# Patient Record
Sex: Female | Born: 1960 | Race: White | Hispanic: No | Marital: Married | Smoking: Never smoker
Health system: Southern US, Community
[De-identification: ages and names within clinical notes are randomized; demographics above are authoritative.]

## PROBLEM LIST (undated history)

## (undated) DIAGNOSIS — N63 Unspecified lump in unspecified breast: Secondary | ICD-10-CM

## (undated) DIAGNOSIS — I10 Essential (primary) hypertension: Secondary | ICD-10-CM

## (undated) DIAGNOSIS — Z1211 Encounter for screening for malignant neoplasm of colon: Secondary | ICD-10-CM

## (undated) DIAGNOSIS — Z9889 Other specified postprocedural states: Secondary | ICD-10-CM

## (undated) HISTORY — DX: Encounter for screening for malignant neoplasm of colon: Z12.11

## (undated) HISTORY — DX: Unspecified lump in unspecified breast: N63.0

## (undated) HISTORY — DX: Other specified postprocedural states: Z98.890

## (undated) HISTORY — DX: Essential (primary) hypertension: I10

---

## 2005-02-07 HISTORY — PX: BREAST BIOPSY: SHX20

## 2007-02-08 DIAGNOSIS — I10 Essential (primary) hypertension: Secondary | ICD-10-CM

## 2007-02-08 HISTORY — DX: Essential (primary) hypertension: I10

## 2009-02-07 DIAGNOSIS — N63 Unspecified lump in unspecified breast: Secondary | ICD-10-CM

## 2009-02-07 HISTORY — DX: Unspecified lump in unspecified breast: N63.0

## 2010-01-07 DIAGNOSIS — Z9889 Other specified postprocedural states: Secondary | ICD-10-CM

## 2010-01-07 HISTORY — DX: Other specified postprocedural states: Z98.890

## 2010-02-04 ENCOUNTER — Ambulatory Visit: Payer: Self-pay | Admitting: General Surgery

## 2010-02-04 DIAGNOSIS — D241 Benign neoplasm of right breast: Secondary | ICD-10-CM | POA: Insufficient documentation

## 2010-02-04 HISTORY — PX: BREAST SURGERY: SHX581

## 2010-02-05 LAB — PATHOLOGY REPORT

## 2010-02-07 HISTORY — PX: COLONOSCOPY: SHX174

## 2010-03-11 ENCOUNTER — Ambulatory Visit: Payer: Self-pay | Admitting: General Surgery

## 2010-03-11 HISTORY — PX: BREAST SURGERY: SHX581

## 2010-03-16 LAB — PATHOLOGY REPORT

## 2010-07-14 ENCOUNTER — Ambulatory Visit: Payer: Self-pay | Admitting: General Surgery

## 2011-01-17 ENCOUNTER — Ambulatory Visit: Payer: Self-pay | Admitting: General Surgery

## 2011-01-25 LAB — HM COLONOSCOPY

## 2012-01-18 ENCOUNTER — Ambulatory Visit: Payer: Self-pay | Admitting: General Surgery

## 2012-07-30 ENCOUNTER — Encounter: Payer: Self-pay | Admitting: *Deleted

## 2012-07-30 DIAGNOSIS — N63 Unspecified lump in unspecified breast: Secondary | ICD-10-CM | POA: Insufficient documentation

## 2013-01-15 ENCOUNTER — Encounter: Payer: Self-pay | Admitting: *Deleted

## 2013-01-28 ENCOUNTER — Ambulatory Visit: Payer: Self-pay | Admitting: General Surgery

## 2013-02-04 ENCOUNTER — Encounter: Payer: Self-pay | Admitting: General Surgery

## 2013-02-19 ENCOUNTER — Ambulatory Visit (INDEPENDENT_AMBULATORY_CARE_PROVIDER_SITE_OTHER): Payer: BC Managed Care – PPO | Admitting: General Surgery

## 2013-02-19 ENCOUNTER — Encounter: Payer: Self-pay | Admitting: General Surgery

## 2013-02-19 VITALS — BP 138/82 | HR 70 | Resp 12 | Ht 64.0 in | Wt 141.0 lb

## 2013-02-19 DIAGNOSIS — D4860 Neoplasm of uncertain behavior of unspecified breast: Secondary | ICD-10-CM

## 2013-02-19 DIAGNOSIS — Z86018 Personal history of other benign neoplasm: Secondary | ICD-10-CM | POA: Insufficient documentation

## 2013-02-19 NOTE — Patient Instructions (Addendum)
Continue self breast exams. Call office for any new breast issues or concerns. Bilateral screening mammogram and office visit.

## 2013-02-19 NOTE — Progress Notes (Signed)
Patient ID: Alicia Copeland, female   DOB: [birth year], 53 y.o.   MRN: [id number]  Chief Complaint  Patient presents with  . Follow-up    mammogram    HPI Alicia Copeland is a 53 y.o. female.  who presents for her follow up mammogram and  breast evaluation. The most recent mammogram was done on 01-28-13.  Patient does perform regular self breast checks and gets regular mammograms done.  No new breast issues.   HPI  Past Medical History  Diagnosis Date  . Hypertension 2009  . Special screening for malignant neoplasms, colon   . Lump or mass in breast 2011  . 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    Past Surgical History  Procedure Laterality Date  . Breast surgery Right 03/11/2010    reexcision right breast lesion at the 10:00 position with no evidence of residual flow in tumor  . Colonoscopy  2012    Dr. Candace Cruise  . Breast surgery Right 02/04/2010    right breast excision  . Breast biopsy Right 2007    done in [facility]    Family History  Problem Relation Age of Onset  . Cancer Mother     breast cancer, lymphoma, greater than age 39  . Cancer Maternal Grandmother 57    colon cancer, greater than age 4  . Cancer Paternal Grandmother     breast cancer, greater than age 42    Social History History  Substance Use Topics  . Smoking status: Never Smoker   . Smokeless tobacco: Not on file  . Alcohol Use: No    Allergies  Allergen Reactions  . Sulfa Antibiotics Hives and Other (See Comments)    Alters liver function. "breaks out in liver"    Current Outpatient Prescriptions  Medication Sig Dispense Refill  . Cholecalciferol (VITAMIN D-3 PO) Take 400 mg by mouth daily.      Marland Kitchen lisinopril-hydrochlorothiazide (PRINZIDE,ZESTORETIC) 20-12.5 MG per tablet Take 1 tablet by  mouth daily.      . Multiple Vitamin (MULTIVITAMIN) capsule Take 1 capsule by mouth daily.      . Omega-3 Fatty Acids (FISH OIL) 1000 MG CAPS Take 1,000 mg by mouth daily.       No current facility-administered medications for this visit.    Review of Systems Review of Systems  Blood pressure 138/82, pulse 70, resp. rate 12, height 5\' 4"  (1.626 m), weight 141 lb (63.957 kg), last menstrual period 01/27/2013.  Physical Exam Physical Exam  Constitutional: She is oriented to person, place, and time. She appears well-developed and well-nourished.  Eyes: No scleral icterus.  Neck: Neck supple.  Cardiovascular: Normal rate, regular rhythm and normal heart sounds.   Pulmonary/Chest: Effort normal and breath sounds normal. Right breast exhibits no inverted nipple, no mass, no nipple discharge, no skin change and no tenderness. Left breast exhibits no inverted nipple, no mass, no nipple discharge, no skin change and no tenderness.  Lymphadenopathy:    She has no cervical adenopathy.    She has no axillary adenopathy.  Neurological: She is alert and oriented to person, place, and time.  Skin: Skin is warm and dry.    Data Reviewed Bilateral mammograms dated January 28, 2013 were reviewed. Stable asymmetric densities are reported and unchanged from past exams. BI-RAD-2.  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     Plan    Bilateral screening mammogram and office visit in one year.        Robert Bellow 02/19/2013, 8:10 PM

## 2013-03-08 ENCOUNTER — Encounter: Payer: Self-pay | Admitting: General Surgery

## 2013-07-22 LAB — HM PAP SMEAR

## 2013-12-09 ENCOUNTER — Encounter: Payer: Self-pay | Admitting: General Surgery

## 2014-02-12 ENCOUNTER — Encounter: Payer: Self-pay | Admitting: General Surgery

## 2014-02-18 ENCOUNTER — Ambulatory Visit: Payer: BC Managed Care – PPO | Admitting: General Surgery

## 2014-02-24 ENCOUNTER — Ambulatory Visit (INDEPENDENT_AMBULATORY_CARE_PROVIDER_SITE_OTHER): Payer: BC Managed Care – PPO | Admitting: General Surgery

## 2014-02-24 ENCOUNTER — Other Ambulatory Visit: Payer: BC Managed Care – PPO

## 2014-02-24 ENCOUNTER — Encounter: Payer: Self-pay | Admitting: General Surgery

## 2014-02-24 VITALS — BP 118/68 | HR 72 | Resp 14 | Ht 64.0 in | Wt 128.0 lb

## 2014-02-24 DIAGNOSIS — N6310 Unspecified lump in the right breast, unspecified quadrant: Secondary | ICD-10-CM

## 2014-02-24 DIAGNOSIS — R928 Other abnormal and inconclusive findings on diagnostic imaging of breast: Secondary | ICD-10-CM

## 2014-02-24 DIAGNOSIS — N63 Unspecified lump in breast: Secondary | ICD-10-CM

## 2014-02-24 NOTE — Progress Notes (Signed)
Patient ID: [name], female   DOB: January 30, 1961, 54 y.o.   MRN: [id number]  Chief Complaint  Patient presents with  . Follow-up    mammogram     HPI NOMI RUDNICKI is a 54 y.o. female who presents for a breast evaluation. The most recent mammogram was done on 02/11/14 at [facility]. Patient does perform regular self breast checks and gets regular mammograms done.    HPI  Past Medical History  Diagnosis Date  . Hypertension 2009  . Special screening for malignant neoplasms, colon   . Lump or mass in breast 2011  . 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    Past Surgical History  Procedure Laterality Date  . Colonoscopy  2012    Dr. Candace Cruise  . Breast surgery Right 03/11/2010    Reexcision right breast lesion at the 10:00 position with no evidence of residual flow in tumor  . Breast surgery Right 02/04/2010    Right breast re-excision, no evidence of recurrent phyllodes tumor  . Breast biopsy Right 2007    Fibroadenoma, core biopsy, ER/PR negative.done in [place]    Family History  Problem Relation Age of Onset  . Cancer Mother     breast cancer, lymphoma, greater than age 36  . Cancer Maternal Grandmother 19    colon cancer, greater than age 30  . Cancer Paternal Grandmother     breast cancer, greater than age 90    Social History History  Substance Use Topics  . Smoking status: Never Smoker   . Smokeless tobacco: Not on file  . Alcohol Use: No    Allergies  Allergen Reactions  . Sulfa Antibiotics Hives and Other (See Comments)    Alters liver function. "breaks out in liver"    Current Outpatient Prescriptions  Medication Sig Dispense Refill  . lisinopril-hydrochlorothiazide (PRINZIDE,ZESTORETIC) 20-12.5 MG per tablet Take 1 tablet by mouth daily.    .  Multiple Vitamin (MULTIVITAMIN) capsule Take 1 capsule by mouth daily.    . Omega-3 Fatty Acids (FISH OIL) 1000 MG CAPS Take 1,000 mg by mouth daily.     No current facility-administered medications for this visit.    Review of Systems Review of Systems  Constitutional: Negative.   Respiratory: Negative.   Cardiovascular: Negative.     Blood pressure 118/68, pulse 72, resp. rate 14, height 5\' 4"  (1.626 m), weight 128 lb (58.06 kg), last menstrual period 02/04/2014.  Physical Exam Physical Exam  Constitutional: She is oriented to person, place, and time. She appears well-developed and well-nourished.  Eyes: Conjunctivae are normal. No scleral icterus.  Neck: Neck supple.  Cardiovascular: Normal rate, regular rhythm and normal heart sounds.   Pulmonary/Chest: Effort normal and breath sounds normal. Right breast exhibits no inverted nipple, no mass, no nipple discharge, no skin change and no tenderness. Left breast exhibits no inverted nipple, no mass, no nipple discharge, no skin change and no tenderness.  Well healed scar at 9 o'clock right breast   Abdominal: Soft. Bowel sounds are normal. There is no tenderness.  Lymphadenopathy:    She has no cervical adenopathy.    She has no axillary adenopathy.  Neurological: She is alert and oriented to person, place, and time.  Skin: Skin is warm and dry.    Data Reviewed Bilateral  mammograms dated 02/11/2014 were reviewed. This was a screening study. Surgical changes are noted in the right breast. Multiple stable asymmetrical density is appreciated. No interval change. Questionable new nodule in the right medial breast. BI-RADS-0.  Review of these films in comparison to previous studies suggest that this may be an area of focal density rather discrete nodule.  Ultrasound examination of the right breast was completed in the medial half. There is a ill-defined hypoechoic area with some posterior acoustic shadowing measuring 0.44 x 0.68 x  0.78 cm at the 3:00 position 8 cm from the nipple. This does not clearly correlate with the mammographic abnormality. At the 10:00 position a small cystic lesion is identified 3 cm from the nipple measuring less than 0.41 cm in maximal diameter. BI-RADS-3.    Assessment    Abnormal mammogram, questionable ultrasound corollary.    Plan    My initial thought was that a stereotactic biopsy would be mandatory, on review of her old films I think he would be appropriate to obtain the focal spot compression films suggested by the radiologist. If this area persist would proceed to stereotactic biopsy as the density is more clearly seen on mammography than ultrasound. If this area clears on focal spot compression views, we'll likely arrange for a 6 month follow-up with repeat ultrasound at that time.  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    PCP:  Kirkland Hun 02/25/2014, 7:00 PM

## 2014-02-25 ENCOUNTER — Telehealth: Payer: Self-pay

## 2014-02-25 DIAGNOSIS — R928 Other abnormal and inconclusive findings on diagnostic imaging of breast: Secondary | ICD-10-CM | POA: Insufficient documentation

## 2014-02-25 NOTE — Telephone Encounter (Signed)
Called patient about having additional views done of her right breast. Patient is scheduled for additional mammogram views with [facility] of the right breast at [facility] on 03/07/14 at 4:00 pm. Patient is aware of date and time.

## 2014-03-06 ENCOUNTER — Ambulatory Visit: Payer: BC Managed Care – PPO | Admitting: General Surgery

## 2014-03-12 ENCOUNTER — Ambulatory Visit: Payer: BC Managed Care – PPO | Admitting: General Surgery

## 2014-08-27 ENCOUNTER — Other Ambulatory Visit: Payer: Self-pay

## 2014-08-27 MED ORDER — LISINOPRIL-HYDROCHLOROTHIAZIDE 20-12.5 MG PO TABS: 1.0000 | ORAL_TABLET | Freq: Every day | ORAL | Status: DC

## 2014-08-27 NOTE — Telephone Encounter (Signed)
Patient was last seen on 05/02/14, practice partner number is [phone], and pharmacy is [facility] on [address].

## 2015-02-25 ENCOUNTER — Other Ambulatory Visit: Payer: Self-pay | Admitting: Unknown Physician Specialty

## 2015-02-25 NOTE — Telephone Encounter (Signed)
Pt needs check further refills 

## 2015-03-26 ENCOUNTER — Other Ambulatory Visit: Payer: Self-pay | Admitting: Unknown Physician Specialty

## 2015-03-26 NOTE — Telephone Encounter (Signed)
Pt needs check further refills 

## 2015-04-25 ENCOUNTER — Other Ambulatory Visit: Payer: Self-pay | Admitting: Unknown Physician Specialty

## 2015-04-26 NOTE — Telephone Encounter (Signed)
Pt needs seen further refills 

## 2015-05-12 NOTE — Telephone Encounter (Signed)
Called left patient voicemail to return call and schedule follow up appointment. Thanks.

## 2015-05-13 NOTE — Telephone Encounter (Signed)
Called and left patient a voicemail asking for her to please return my call to schedule a f/u appointment.

## 2015-05-18 NOTE — Telephone Encounter (Signed)
Called and scheduled patient a f/u for 06/03/15.

## 2015-05-23 ENCOUNTER — Other Ambulatory Visit: Payer: Self-pay | Admitting: Unknown Physician Specialty

## 2015-05-25 NOTE — Telephone Encounter (Signed)
Pt needs check further refills 

## 2015-05-25 NOTE — Telephone Encounter (Signed)
Pt scheduled for a medication follow up on 06/03/15. Does she need to be seen before that date?

## 2015-05-25 NOTE — Telephone Encounter (Signed)
No, 06/03/15 is fine.

## 2015-06-03 ENCOUNTER — Ambulatory Visit: Payer: Self-pay | Admitting: Unknown Physician Specialty

## 2015-06-17 ENCOUNTER — Encounter: Payer: Self-pay | Admitting: Unknown Physician Specialty

## 2015-06-17 ENCOUNTER — Ambulatory Visit (INDEPENDENT_AMBULATORY_CARE_PROVIDER_SITE_OTHER): Payer: BC Managed Care – PPO | Admitting: Unknown Physician Specialty

## 2015-06-17 VITALS — BP 107/69 | HR 86 | Temp 99.1°F | Ht 63.5 in | Wt 140.0 lb

## 2015-06-17 DIAGNOSIS — Z87898 Personal history of other specified conditions: Secondary | ICD-10-CM

## 2015-06-17 DIAGNOSIS — Z9189 Other specified personal risk factors, not elsewhere classified: Secondary | ICD-10-CM

## 2015-06-17 DIAGNOSIS — I10 Essential (primary) hypertension: Secondary | ICD-10-CM | POA: Diagnosis not present

## 2015-06-17 LAB — MICROALBUMIN, URINE WAIVED
CREATININE, URINE WAIVED: 200 mg/dL (ref 10–300)
MICROALB, UR WAIVED: 30 mg/L — AB (ref 0–19)

## 2015-06-17 MED ORDER — LISINOPRIL-HYDROCHLOROTHIAZIDE 20-12.5 MG PO TABS: 1.0000 | ORAL_TABLET | Freq: Every day | ORAL | Status: DC

## 2015-06-17 NOTE — Patient Instructions (Signed)
Please do call to schedule your mammogram; the number to schedule one at either [facility] or [facility] is [phone]   

## 2015-06-17 NOTE — Progress Notes (Signed)
   BP 107/69 mmHg  Pulse 86  Temp(Src) 99.1 F (37.3 C)  Ht 5' 3.5" (1.613 m)  Wt 140 lb (63.504 kg)  BMI 24.41 kg/m2  SpO2 94%  LMP 06/11/2015 (Exact Date)   Subjective:    Patient ID: Alicia Copeland, female    DOB: Jul 06, 1960, 55 y.o.   MRN: [id number]  HPI: Alicia Copeland is a 55 y.o. female  Chief Complaint  Patient presents with  . Hypertension    follow up and med refill   Hypertension Using medications without difficulty Average home BPs Not checking  No problems or lightheadedness No chest pain with exertion or shortness of breath No Edema  Relevant past medical, surgical, family and social history reviewed and updated as indicated. Interim medical history since our last visit reviewed. Allergies and medications reviewed and updated.  Review of Systems  Constitutional: Negative.   HENT: Negative.   Eyes: Negative.   Respiratory: Negative.   Cardiovascular: Negative.   Gastrointestinal: Negative.   Endocrine: Negative.   Genitourinary: Negative.   Musculoskeletal: Negative.   Skin: Negative.   Allergic/Immunologic: Negative.   Neurological: Negative.   Hematological: Negative.   Psychiatric/Behavioral: Negative.     Per HPI unless specifically indicated above     Objective:    BP 107/69 mmHg  Pulse 86  Temp(Src) 99.1 F (37.3 C)  Ht 5' 3.5" (1.613 m)  Wt 140 lb (63.504 kg)  BMI 24.41 kg/m2  SpO2 94%  LMP 06/11/2015 (Exact Date)  Wt Readings from Last 3 Encounters:  06/17/15 140 lb (63.504 kg)  02/24/14 128 lb (58.06 kg)  02/19/13 141 lb (63.957 kg)    Physical Exam  Constitutional: She is oriented to person, place, and time. She appears well-developed and well-nourished. No distress.  HENT:  Head: Normocephalic and atraumatic.  Eyes: Conjunctivae and lids are normal. Right eye exhibits no discharge. Left eye exhibits no discharge. No scleral icterus.  Neck: Normal range of motion. Neck supple. No JVD present. Carotid bruit is not  present.  Cardiovascular: Normal rate, regular rhythm and normal heart sounds.   Pulmonary/Chest: Effort normal and breath sounds normal.  Abdominal: Normal appearance. There is no splenomegaly or hepatomegaly.  Musculoskeletal: Normal range of motion.  Neurological: She is alert and oriented to person, place, and time.  Skin: Skin is warm, dry and intact. No rash noted. No pallor.  Psychiatric: She has a normal mood and affect. Her behavior is normal. Judgment and thought content normal.       Assessment & Plan:   Problem List Items Addressed This Visit      Unprioritized   Hypertension - Primary   Relevant Medications   lisinopril-hydrochlorothiazide (PRINZIDE,ZESTORETIC) 20-12.5 MG tablet   Other Relevant Orders   Comprehensive metabolic panel   Lipid Panel w/o Chol/HDL Ratio   Microalbumin, Urine Waived   Uric acid   TSH    Other Visit Diagnoses    H/O abnormal mammogram        Relevant Orders    MM DIGITAL SCREENING BILATERAL        Follow up plan: Return in about 6 months (around 12/18/2015) for physican.

## 2015-06-18 LAB — COMPREHENSIVE METABOLIC PANEL
A/G RATIO: 1.8 (ref 1.2–2.2)
ALK PHOS: 57 IU/L (ref 39–117)
ALT: 13 IU/L (ref 0–32)
AST: 14 IU/L (ref 0–40)
Albumin: 4.2 g/dL (ref 3.5–5.5)
BUN/Creatinine Ratio: 19 (ref 9–23)
BUN: 20 mg/dL (ref 6–24)
Bilirubin Total: <0.2 mg/dL (ref 0.0–1.2)
CO2: 29 mmol/L (ref 18–29)
CREATININE: 1.04 mg/dL — AB (ref 0.57–1.00)
Calcium: 9.1 mg/dL (ref 8.7–10.2)
Chloride: 97 mmol/L (ref 96–106)
GFR calc Af Amer: 71 mL/min/{1.73_m2} (ref >59)
GFR calc non Af Amer: 61 mL/min/{1.73_m2} (ref >59)
GLOBULIN, TOTAL: 2.4 g/dL (ref 1.5–4.5)
Glucose: 83 mg/dL (ref 65–99)
POTASSIUM: 4.1 mmol/L (ref 3.5–5.2)
SODIUM: 139 mmol/L (ref 134–144)
Total Protein: 6.6 g/dL (ref 6.0–8.5)

## 2015-06-18 LAB — LIPID PANEL W/O CHOL/HDL RATIO
CHOLESTEROL TOTAL: 207 mg/dL — AB (ref 100–199)
HDL: 37 mg/dL — ABNORMAL LOW (ref >39)
LDL Calculated: 115 mg/dL — ABNORMAL HIGH (ref 0–99)
TRIGLYCERIDES: 274 mg/dL — AB (ref 0–149)
VLDL Cholesterol Cal: 55 mg/dL — ABNORMAL HIGH (ref 5–40)

## 2015-06-18 LAB — URIC ACID: Uric Acid: 6.2 mg/dL (ref 2.5–7.1)

## 2015-06-18 LAB — TSH: TSH: 2.04 u[IU]/mL (ref 0.450–4.500)

## 2015-06-19 ENCOUNTER — Encounter: Payer: Self-pay | Admitting: Unknown Physician Specialty

## 2015-06-26 ENCOUNTER — Other Ambulatory Visit: Payer: Self-pay | Admitting: Unknown Physician Specialty

## 2015-12-16 ENCOUNTER — Encounter (INDEPENDENT_AMBULATORY_CARE_PROVIDER_SITE_OTHER): Payer: Self-pay

## 2015-12-23 ENCOUNTER — Other Ambulatory Visit: Payer: Self-pay | Admitting: Unknown Physician Specialty

## 2015-12-30 ENCOUNTER — Ambulatory Visit (INDEPENDENT_AMBULATORY_CARE_PROVIDER_SITE_OTHER): Payer: BC Managed Care – PPO | Admitting: Unknown Physician Specialty

## 2015-12-30 ENCOUNTER — Encounter: Payer: Self-pay | Admitting: Unknown Physician Specialty

## 2015-12-30 VITALS — BP 132/85 | HR 79 | Temp 97.8°F | Ht 63.2 in | Wt 143.2 lb

## 2015-12-30 DIAGNOSIS — Z0000 Encounter for general adult medical examination without abnormal findings: Secondary | ICD-10-CM | POA: Diagnosis not present

## 2015-12-30 DIAGNOSIS — I10 Essential (primary) hypertension: Secondary | ICD-10-CM | POA: Diagnosis not present

## 2015-12-30 LAB — MICROALBUMIN, URINE WAIVED
CREATININE, URINE WAIVED: 200 mg/dL (ref 10–300)
Microalb, Ur Waived: 10 mg/L (ref 0–19)

## 2015-12-30 NOTE — Patient Instructions (Signed)
Please do call to schedule your mammogram; the number to schedule one at either [facility] or [facility] is [phone]   

## 2015-12-30 NOTE — Progress Notes (Signed)
BP 132/85 (BP Location: Left Arm, Patient Position: Sitting, Cuff Size: Large)   Pulse 79   Temp 97.8 F (36.6 C)   Ht 5' 3.2" (1.605 m)   Wt 143 lb 3.2 oz (65 kg)   LMP 06/29/2015   SpO2 98%   BMI 25.21 kg/m    Subjective:    Patient ID: Alicia Copeland, female    DOB: Sep 10, 1960, 55 y.o.   MRN: [id number]  HPI: Alicia Copeland is a 55 y.o. female  Chief Complaint  Patient presents with  . Annual Exam   Hypertension Using medications without difficulty Average home BPs not checking   No problems or lightheadedness No chest pain with exertion or shortness of breath No Edema  Depression screen PHQ 2/9 12/30/2015  Decreased Interest 0  Down, Depressed, Hopeless 0  PHQ - 2 Score 0  Altered sleeping 0  Tired, decreased energy 0  Change in appetite 0  Feeling bad or failure about yourself  0  Trouble concentrating 0  Moving slowly or fidgety/restless 0  Suicidal thoughts 0  PHQ-9 Score 0   -+------------------ Social History   Social History  . Marital status: Married    Spouse name: N/A  . Number of children: N/A  . Years of education: N/A   Occupational History  . Not on file.   Social History Main Topics  . Smoking status: Never Smoker  . Smokeless tobacco: Never Used  . Alcohol use No  . Drug use: No  . Sexual activity: Yes   Other Topics Concern  . Not on file   Social History Narrative  . No narrative on file   Family History  Problem Relation Age of Onset  . Cancer Mother     breast cancer, lymphoma, greater than age 26  . Diabetes Mother   . Cancer Maternal Grandmother 74    colon cancer, greater than age 69  . Cancer Paternal Grandmother     breast cancer, greater than age 64  . Hypertension Father   . Diabetes Father   . Heart attack Father   . Hypertension Brother   . Heart attack Maternal Grandfather    Past Medical History:  Diagnosis Date  . Hx of breast biopsy 01/2010   right breast at 10:00 position,borderline  phyllodes tumor measuring 3.2cm in diameter with involvement of anterior, dep and lateral margins. 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  . Hypertension 2009  . Lump or mass in breast 2011  . Special screening for malignant neoplasms, colon    Past Surgical History:  Procedure Laterality Date  . BREAST BIOPSY Right 2007   Fibroadenoma, core biopsy, ER/PR negative.done in [place]  . BREAST SURGERY Right 03/11/2010   Reexcision right breast lesion at the 10:00 position with no evidence of residual flow in tumor  . BREAST SURGERY Right 02/04/2010   Right breast re-excision, no evidence of recurrent phyllodes tumor  . COLONOSCOPY  2012   Dr. Candace Cruise    Relevant past medical, surgical, family and social history reviewed and updated as indicated. Interim medical history since our last visit reviewed. Allergies and medications reviewed and updated.  Review of Systems  Constitutional: Negative.   HENT: Negative.   Eyes: Negative.   Respiratory: Negative.   Cardiovascular: Negative.   Gastrointestinal: Negative.   Endocrine: Negative.   Genitourinary: Negative.        No menses since May  Musculoskeletal: Negative.   Skin:  Negative.   Allergic/Immunologic: Negative.   Neurological: Negative.   Hematological: Negative.   Psychiatric/Behavioral: Negative.     Per HPI unless specifically indicated above     Objective:    BP 132/85 (BP Location: Left Arm, Patient Position: Sitting, Cuff Size: Large)   Pulse 79   Temp 97.8 F (36.6 C)   Ht 5' 3.2" (1.605 m)   Wt 143 lb 3.2 oz (65 kg)   LMP 06/29/2015   SpO2 98%   BMI 25.21 kg/m   Wt Readings from Last 3 Encounters:  12/30/15 143 lb 3.2 oz (65 kg)  06/17/15 140 lb (63.5 kg)  02/24/14 128 lb (58.1 kg)    Physical Exam  Constitutional: She is oriented to person, place, and time. She appears well-developed and well-nourished. No distress.  HENT:  Head: Normocephalic and atraumatic.    Eyes: Conjunctivae and lids are normal. Right eye exhibits no discharge. Left eye exhibits no discharge. No scleral icterus.  Neck: Normal range of motion. Neck supple. No JVD present. Carotid bruit is not present.  Cardiovascular: Normal rate, regular rhythm and normal heart sounds.   Pulmonary/Chest: Effort normal and breath sounds normal.  Abdominal: Normal appearance. There is no splenomegaly or hepatomegaly.  Musculoskeletal: Normal range of motion.  Neurological: She is alert and oriented to person, place, and time.  Skin: Skin is warm, dry and intact. No rash noted. No pallor.  Psychiatric: She has a normal mood and affect. Her behavior is normal. Judgment and thought content normal.    Results for orders placed or performed in visit on 06/17/15  Comprehensive metabolic panel  Result Value Ref Range   Glucose 83 65 - 99 mg/dL   BUN 20 6 - 24 mg/dL   Creatinine, Ser 1.04 (H) 0.57 - 1.00 mg/dL   GFR calc non Af Amer 61 >59 mL/min/1.73   GFR calc Af Amer 71 >59 mL/min/1.73   BUN/Creatinine Ratio 19 9 - 23   Sodium 139 134 - 144 mmol/L   Potassium 4.1 3.5 - 5.2 mmol/L   Chloride 97 96 - 106 mmol/L   CO2 29 18 - 29 mmol/L   Calcium 9.1 8.7 - 10.2 mg/dL   Total Protein 6.6 6.0 - 8.5 g/dL   Albumin 4.2 3.5 - 5.5 g/dL   Globulin, Total 2.4 1.5 - 4.5 g/dL   Albumin/Globulin Ratio 1.8 1.2 - 2.2   Bilirubin Total <0.2 0.0 - 1.2 mg/dL   Alkaline Phosphatase 57 39 - 117 IU/L   AST 14 0 - 40 IU/L   ALT 13 0 - 32 IU/L  Lipid Panel w/o Chol/HDL Ratio  Result Value Ref Range   Cholesterol, Total 207 (H) 100 - 199 mg/dL   Triglycerides 274 (H) 0 - 149 mg/dL   HDL 37 (L) >39 mg/dL   VLDL Cholesterol Cal 55 (H) 5 - 40 mg/dL   LDL Calculated 115 (H) 0 - 99 mg/dL  Microalbumin, Urine Waived  Result Value Ref Range   Microalb, Ur Waived 30 (H) 0 - 19 mg/L   Creatinine, Urine Waived 200 10 - 300 mg/dL   Microalb/Creat Ratio <30 <30 mg/g  Uric acid  Result Value Ref Range   Uric Acid  6.2 2.5 - 7.1 mg/dL  TSH  Result Value Ref Range   TSH 2.040 0.450 - 4.500 uIU/mL      Assessment & Plan:   Problem List Items Addressed This Visit      Unprioritized   Hypertension    Stable,  continue present medications.        Relevant Orders   Comprehensive metabolic panel   Lipid Panel w/o Chol/HDL Ratio   Uric acid   Microalbumin, Urine Waived    Other Visit Diagnoses    Routine general medical examination at a health care facility    -  Primary   Relevant Orders   MM DIGITAL SCREENING BILATERAL   TSH   CBC with Differential/Platelet   Lipid Panel w/o Chol/HDL Ratio       Follow up plan: Return in about 6 months (around 06/28/2016).

## 2015-12-30 NOTE — Assessment & Plan Note (Signed)
Stable, continue present medications.   

## 2015-12-31 LAB — CBC WITH DIFFERENTIAL/PLATELET
BASOS: 0 %
Basophils Absolute: 0 10*3/uL (ref 0.0–0.2)
EOS (ABSOLUTE): 0.2 10*3/uL (ref 0.0–0.4)
EOS: 2 %
HEMATOCRIT: 37.4 % (ref 34.0–46.6)
HEMOGLOBIN: 12.6 g/dL (ref 11.1–15.9)
Immature Grans (Abs): 0 10*3/uL (ref 0.0–0.1)
Immature Granulocytes: 0 %
LYMPHS ABS: 2.7 10*3/uL (ref 0.7–3.1)
Lymphs: 27 %
MCH: 29.4 pg (ref 26.6–33.0)
MCHC: 33.7 g/dL (ref 31.5–35.7)
MCV: 87 fL (ref 79–97)
MONOCYTES: 7 %
MONOS ABS: 0.7 10*3/uL (ref 0.1–0.9)
NEUTROS ABS: 6.3 10*3/uL (ref 1.4–7.0)
Neutrophils: 64 %
Platelets: 215 10*3/uL (ref 150–379)
RBC: 4.29 x10E6/uL (ref 3.77–5.28)
RDW: 15.3 % (ref 12.3–15.4)
WBC: 9.8 10*3/uL (ref 3.4–10.8)

## 2015-12-31 LAB — LIPID PANEL W/O CHOL/HDL RATIO
Cholesterol, Total: 214 mg/dL — ABNORMAL HIGH (ref 100–199)
HDL: 49 mg/dL (ref >39)
LDL CALC: 134 mg/dL — AB (ref 0–99)
TRIGLYCERIDES: 156 mg/dL — AB (ref 0–149)
VLDL Cholesterol Cal: 31 mg/dL (ref 5–40)

## 2015-12-31 LAB — COMPREHENSIVE METABOLIC PANEL
ALBUMIN: 4.6 g/dL (ref 3.5–5.5)
ALK PHOS: 73 IU/L (ref 39–117)
ALT: 14 IU/L (ref 0–32)
AST: 13 IU/L (ref 0–40)
Albumin/Globulin Ratio: 2.4 — ABNORMAL HIGH (ref 1.2–2.2)
BUN / CREAT RATIO: 16 (ref 9–23)
BUN: 12 mg/dL (ref 6–24)
Bilirubin Total: 0.4 mg/dL (ref 0.0–1.2)
CO2: 28 mmol/L (ref 18–29)
CREATININE: 0.73 mg/dL (ref 0.57–1.00)
Calcium: 9.7 mg/dL (ref 8.7–10.2)
Chloride: 101 mmol/L (ref 96–106)
GFR, EST AFRICAN AMERICAN: 108 mL/min/{1.73_m2} (ref >59)
GFR, EST NON AFRICAN AMERICAN: 94 mL/min/{1.73_m2} (ref >59)
GLOBULIN, TOTAL: 1.9 g/dL (ref 1.5–4.5)
Glucose: 71 mg/dL (ref 65–99)
Potassium: 4.2 mmol/L (ref 3.5–5.2)
SODIUM: 145 mmol/L — AB (ref 134–144)
TOTAL PROTEIN: 6.5 g/dL (ref 6.0–8.5)

## 2015-12-31 LAB — TSH: TSH: 2.62 u[IU]/mL (ref 0.450–4.500)

## 2015-12-31 LAB — URIC ACID: URIC ACID: 4.5 mg/dL (ref 2.5–7.1)

## 2016-01-04 ENCOUNTER — Encounter: Payer: Self-pay | Admitting: Unknown Physician Specialty

## 2016-04-05 ENCOUNTER — Ambulatory Visit: Payer: BC Managed Care – PPO | Admitting: Family Medicine

## 2016-04-06 ENCOUNTER — Ambulatory Visit (INDEPENDENT_AMBULATORY_CARE_PROVIDER_SITE_OTHER): Payer: BC Managed Care – PPO | Admitting: Unknown Physician Specialty

## 2016-04-06 ENCOUNTER — Encounter: Payer: Self-pay | Admitting: Unknown Physician Specialty

## 2016-04-06 ENCOUNTER — Ambulatory Visit: Payer: BC Managed Care – PPO | Admitting: Unknown Physician Specialty

## 2016-04-06 VITALS — BP 115/74 | HR 90 | Temp 98.2°F | Wt 147.8 lb

## 2016-04-06 DIAGNOSIS — M7022 Olecranon bursitis, left elbow: Secondary | ICD-10-CM | POA: Diagnosis not present

## 2016-04-06 DIAGNOSIS — M1711 Unilateral primary osteoarthritis, right knee: Secondary | ICD-10-CM | POA: Diagnosis not present

## 2016-04-06 NOTE — Patient Instructions (Addendum)
Glucosamine Tumeric Tart cherry juice Gin soaked raisins Tylenol/Aleve/Advil  Bursitis Bursitis is inflammation and irritation of a bursa, which is one of the small, fluid-filled sacs that cushion and protect the moving parts of your body. These sacs are located between bones and muscles, muscle attachments, or skin areas next to bones. A bursa protects these structures from the wear and tear that results from frequent movement. An inflamed bursa causes pain and swelling. Fluid may build up inside the sac. Bursitis is most common near joints, especially the knees, elbows, hips, and shoulders. What are the causes? Bursitis can be caused by:  Injury from:  A direct blow, like falling on your knee or elbow.  Overuse of a joint (repetitive stress).  Infection. This can happen if bacteria gets into a bursa through a cut or scrape near a joint.  Diseases that cause joint inflammation, such as gout and rheumatoid arthritis. What increases the risk? You may be at risk for bursitis if you:  Have a job or hobby that involves a lot of repetitive stress on your joints.  Have a condition that weakens your body's defense system (immune system), such as diabetes, cancer, or HIV.  Lift and reach overhead often.  Kneel or lean on hard surfaces often.  Run or walk often. What are the signs or symptoms? The most common signs and symptoms of bursitis are:  Pain that gets worse when you move the affected body part or put weight on it.  Inflammation.  Stiffness. Other signs and symptoms may include:  Redness.  Tenderness.  Warmth.  Pain that continues after rest.  Fever and chills. This may occur in bursitis caused by infection. How is this diagnosed? Bursitis may be diagnosed by:  Medical history and physical exam.  MRI.  A procedure to drain fluid from the bursa with a needle (aspiration). The fluid may be checked for signs of infection or gout.  Blood tests to rule out other  causes of inflammation. How is this treated? Bursitis can usually be treated at home with rest, ice, compression, and elevation (RICE). For mild bursitis, RICE treatment may be all you need. Other treatments may include:  Nonsteroidal anti-inflammatory drugs (NSAIDs) to treat pain and inflammation.  Corticosteroids to fight inflammation. You may have these drugs injected into and around the area of bursitis.  Aspiration of bursitis fluid to relieve pain and improve movement.  Antibiotic medicine to treat an infected bursa.  A splint, brace, or walking aid.  Physical therapy if you continue to have pain or limited movement.  Surgery to remove a damaged or infected bursa. This may be needed if you have a very bad case of bursitis or if other treatments have not worked. Follow these instructions at home:  Take medicines only as directed by your health care provider.  If you were prescribed an antibiotic medicine, finish it all even if you start to feel better.  Rest the affected area as directed by your health care provider.  Keep the area elevated.  Avoid activities that make pain worse.  Apply ice to the injured area:  Place ice in a plastic bag.  Place a towel between your skin and the bag.  Leave the ice on for 20 minutes, 2-3 times a day.  Use splints, braces, pads, or walking aids as directed by your health care provider.  Keep all follow-up visits as directed by your health care provider. This is important. How is this prevented?  Wear knee pads if  you kneel often.  Wear sturdy running or walking shoes that fit you well.  Take regular breaks from repetitive activity.  Warm up by stretching before doing any strenuous activity.  Maintain a healthy weight or lose weight as recommended by your health care provider. Ask your health care provider if you need help.  Exercise regularly. Start any new physical activity gradually. Contact a health care provider  if:  Your bursitis is not responding to treatment or home care.  You have a fever.  You have chills. This information is not intended to replace advice given to you by your health care provider. Make sure you discuss any questions you have with your health care provider. Document Released: 01/22/2000 Document Revised: 07/02/2015 Document Reviewed: 04/15/2013 Elsevier Interactive Patient Education  2017 [place].  What You Need to Know About Osteoarthritis Osteoarthritis is a type of arthritis that affects tissue that covers the ends of bones in joints (cartilage). Cartilage acts as a cushion between the bones and helps them move smoothly. Osteoarthritis results when cartilage in the joints gets worn down. Osteoarthritis is sometimes called "wear and tear" arthritis. Osteoarthritis can affect any joint and can make movement painful. Hips, knees, fingers, and toes are some of the joints that are most often affected by osteoarthritis. You may be more likely to develop osteoarthritis if:  You are middle-aged or older.  You are obese.  You have injured a joint or had surgery on a joint.  You have a family history of osteoarthritis. How can osteoarthritis affect me? Osteoarthritis can cause:  Pain and swelling in your joint.  Difficulty moving your joint.  A grating or scraping feeling inside the joint when you move it.  Popping or creaking sounds when you move. This condition can make it harder to do things that you need or want to do each day. Osteoarthritis in a major joint, such as your knee or hip, can make it painful to walk or exercise. If you have osteoarthritis in your hands, you might not be able to grip items, twist your hand, or control small movements of your hands and fingers (fine motor skills). Over time, osteoarthritis could cause you to be less physically active. Being less active increases your risk for other long-term (chronic) health problems, such as diabetes  and heart disease. What lifestyle changes can be made? You can lessen the impact that osteoarthritis has on your daily life by:  Switching to low-impact activities that do not put repeated pressure on your joints. For example, if you usually run or jog for exercise, try swimming or riding a bike instead.  Staying active. Build up to at least 150 minutes of physical activity each week to keep your joints healthy and keep your body strong.  Losing weight. If you are overweight or obese, losing weight can take pressure off of your joints. If you need help with weight loss, talk with your health care provider or a diet and nutrition specialist (dietitian). What other changes can be made? You can also lessen the effect of osteoarthritis by:  Using assistive devices. Sometimes a brace, wrap, splint, specialized glove, or cane can help. Talk with your health care provider or physical therapist about when and how to use these.  Working with a physical therapist who can help you find ways to do your daily activities without harming your joints. A physical therapist can also teach you exercises and stretches to strengthen the muscles that support your joints.  Treating pain and  inflammation. Take over-the-counter and prescription medicines for pain and inflammation only as told by your health care provider. If directed, you may put ice on the affected joint:  If you have a removable assistive device, remove it as told by your health care provider.  Put ice in a plastic bag.  Place a towel between your skin and the bag.  Leave the ice on for 20 minutes, 2-3 times a day. If other measures do not work, you may need joint surgery, such as joint replacement. What can happen if changes are not made? Osteoarthritis is a condition that gets worse over time (a progressive condition). If you do not take steps to strengthen your body and to slow down the progress of the disease, your condition may get worse  more quickly. Your joints may stiffen and become swollen, which will make them painful and hard to move. Where to find more information: You can learn more about osteoarthritis from:  [employer]: www.RadioScam.is  [employer] of Arthritis and Musculoskeletal and Skin Diseases: www.niams.CityPerson.tn Contact a health care provider if:  You cannot do your normal activities comfortably.  Your joint does not function at all.  Your pain is interfering with your sleep.  You are gaining weight.  Your joint appears to be changing in shape, instead of just being swollen and sore. Summary  Osteoarthritis is a painful joint disease that gets worse over time.  This condition can lead to other long-term (chronic) health problems.  There are changes that you can make to slow down the progression of the disease. This information is not intended to replace advice given to you by your health care provider. Make sure you discuss any questions you have with your health care provider. Document Released: 09/15/2015 Document Revised: 09/17/2015 Document Reviewed: 09/15/2015 Elsevier Interactive Patient Education  2017 [employer].

## 2016-04-06 NOTE — Progress Notes (Signed)
   BP 115/74 (BP Location: Left Arm, Patient Position: Sitting, Cuff Size: Large)   Pulse 90   Temp 98.2 F (36.8 C)   Wt 147 lb 12.8 oz (67 kg)   SpO2 98%   BMI 26.02 kg/m    Subjective:    Patient ID: Alicia Copeland, female    DOB: Jul 07, 1960, 56 y.o.   MRN: [id number]  HPI: Alicia Copeland is a 56 y.o. female  Chief Complaint  Patient presents with  . Knee Pain    pt states her right knee has been hurting for a while, states it gets tight in the back  . Elbow Pain    pt states her left elbow has a pocket of fluid, she states that it is not painful but sensitive   Right knee pain States this has been bothering her for a while.  If she sits for a period of time she gets up and limps.  Unable to fully extend and it gets tight.  Pain comes and goes.    Elbow Pocket of fluid in elbow for a couple of weeks.    Relevant past medical, surgical, family and social history reviewed and updated as indicated. Interim medical history since our last visit reviewed. Allergies and medications reviewed and updated.  Review of Systems  Per HPI unless specifically indicated above     Objective:    BP 115/74 (BP Location: Left Arm, Patient Position: Sitting, Cuff Size: Large)   Pulse 90   Temp 98.2 F (36.8 C)   Wt 147 lb 12.8 oz (67 kg)   SpO2 98%   BMI 26.02 kg/m   Wt Readings from Last 3 Encounters:  04/06/16 147 lb 12.8 oz (67 kg)  12/30/15 143 lb 3.2 oz (65 kg)  06/17/15 140 lb (63.5 kg)    Physical Exam  Constitutional: She is oriented to person, place, and time. She appears well-developed and well-nourished. No distress.  HENT:  Head: Normocephalic and atraumatic.  Eyes: Conjunctivae and lids are normal. Right eye exhibits no discharge. Left eye exhibits no discharge. No scleral icterus.  Neck: Normal range of motion. Neck supple. No JVD present. Carotid bruit is not present.  Cardiovascular: Normal rate, regular rhythm and normal heart sounds.   Pulmonary/Chest:  Effort normal and breath sounds normal.  Abdominal: Normal appearance. There is no splenomegaly or hepatomegaly.  Musculoskeletal: Normal range of motion.       Left elbow: She exhibits effusion. No tenderness found.       Right knee: She exhibits swelling.  Neurological: She is alert and oriented to person, place, and time.  Skin: Skin is warm, dry and intact. No rash noted. No pallor.  Psychiatric: She has a normal mood and affect. Her behavior is normal. Judgment and thought content normal.   Assessment & Plan:   Problem List Items Addressed This Visit      Unprioritized   Osteoarthritis of right knee    Other Visit Diagnoses    Olecranon bursitis of left elbow    -  Primary   Discussed.  Will monitor      Recommended suportive care.    Follow up plan: Return if symptoms worsen or fail to improve.

## 2016-05-03 ENCOUNTER — Other Ambulatory Visit: Payer: Self-pay

## 2016-05-03 DIAGNOSIS — N644 Mastodynia: Secondary | ICD-10-CM

## 2016-05-05 ENCOUNTER — Other Ambulatory Visit: Payer: Self-pay | Admitting: *Deleted

## 2016-05-05 ENCOUNTER — Inpatient Hospital Stay
Admission: RE | Admit: 2016-05-05 | Discharge: 2016-05-05 | Disposition: A | Discharge status: Home / Self Care | Payer: Self-pay | Source: Ambulatory Visit | Attending: *Deleted | Admitting: *Deleted

## 2016-05-05 DIAGNOSIS — Z9289 Personal history of other medical treatment: Secondary | ICD-10-CM

## 2016-05-12 ENCOUNTER — Ambulatory Visit: Payer: BC Managed Care – PPO | Admitting: General Surgery

## 2016-05-13 ENCOUNTER — Ambulatory Visit: Payer: Self-pay

## 2016-05-13 ENCOUNTER — Other Ambulatory Visit: Payer: Self-pay

## 2016-05-18 ENCOUNTER — Ambulatory Visit: Payer: BC Managed Care – PPO | Admitting: General Surgery

## 2016-05-19 ENCOUNTER — Ambulatory Visit
Admission: RE | Admit: 2016-05-19 | Discharge: 2016-05-19 | Disposition: A | Discharge status: Home / Self Care | Payer: BC Managed Care – PPO | Source: Ambulatory Visit | Attending: General Surgery | Admitting: General Surgery

## 2016-05-19 DIAGNOSIS — R922 Inconclusive mammogram: Secondary | ICD-10-CM | POA: Insufficient documentation

## 2016-05-19 DIAGNOSIS — N644 Mastodynia: Secondary | ICD-10-CM | POA: Insufficient documentation

## 2016-05-24 ENCOUNTER — Encounter: Payer: Self-pay | Admitting: General Surgery

## 2016-05-24 ENCOUNTER — Ambulatory Visit (INDEPENDENT_AMBULATORY_CARE_PROVIDER_SITE_OTHER): Payer: BC Managed Care – PPO | Admitting: General Surgery

## 2016-05-24 VITALS — BP 120/82 | HR 80 | Resp 12 | Ht 66.0 in | Wt 145.0 lb

## 2016-05-24 DIAGNOSIS — D241 Benign neoplasm of right breast: Secondary | ICD-10-CM | POA: Diagnosis not present

## 2016-05-24 NOTE — Patient Instructions (Signed)
Patient to return in one year bilateral screening mammogram. 

## 2016-05-24 NOTE — Progress Notes (Signed)
Patient ID: Alicia Copeland, female   DOB: [birth year], 56 y.o.   MRN: [id number]  Chief Complaint  Patient presents with  . Other    HPI Alicia Copeland is a 56 y.o. female who presents for a breast evaluation. The most recent mammogram was done on 05/19/2016 .  She states she is having right breast pain at incision site which was done on 03/11/2010. The pain only lasted for about one month.  Patient does perform regular self breast checks. Last mammogram was done on 03/02/2014.   Patient mom passed away two weeks ago.  HPI  Past Medical History:  Diagnosis Date  . 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  . Hypertension 2009  . Lump or mass in breast 2011  . Special screening for malignant neoplasms, colon     Past Surgical History:  Procedure Laterality Date  . BREAST BIOPSY Right 2007   Fibroadenoma, core biopsy, ER/PR negative.done in [place]  . BREAST SURGERY Right 03/11/2010   Reexcision right breast lesion at the 10:00 position with no evidence of residual flow in tumor  . BREAST SURGERY Right 02/04/2010   Right breast re-excision, no evidence of recurrent phyllodes tumor  . COLONOSCOPY  2012   Dr. Candace Cruise    Family History  Problem Relation Age of Onset  . Diabetes Mother   . Breast cancer Mother   . Colon cancer Mother     breast cancer, lymphoma, greater than age 42  . Cancer Maternal Grandmother 81    colon cancer, greater than age 54  . Cancer Paternal Grandmother     breast cancer, greater than age 55  . Breast cancer Paternal Grandmother   . Hypertension Father   . Diabetes Father   . Heart attack Father   . Hypertension Brother   . Heart attack Maternal Grandfather     Social History Social History  Substance Use Topics  . Smoking status: Never Smoker   . Smokeless tobacco: Never Used  . Alcohol use No    Allergies  Allergen Reactions  . Sulfa Antibiotics Hives and Other (See Comments)    Alters liver function. "breaks out in liver"    Current Outpatient Prescriptions  Medication Sig Dispense Refill  . lisinopril-hydrochlorothiazide (PRINZIDE,ZESTORETIC) 20-12.5 MG tablet TAKE 1 TABLET BY MOUTH DAILY. 90 tablet 1  . Multiple Vitamin (MULTIVITAMIN) capsule Take 1 capsule by mouth daily.    . Omega-3 Fatty Acids (FISH OIL) 1000 MG CAPS Take 1,000 mg by mouth daily.     No current facility-administered medications for this visit.     Review of Systems Review of Systems  Constitutional: Negative.   Respiratory: Negative.   Cardiovascular: Negative.     Blood pressure 120/82, pulse 80, resp. rate 12, height 5\' 6"  (1.676 m), weight 145 lb (65.8 kg).  Physical Exam Physical Exam  Constitutional: She is oriented to person, place, and time. She appears well-developed and well-nourished.  Eyes: Conjunctivae are normal. No scleral icterus.  Neck: Neck supple.  Cardiovascular: Normal rate, regular rhythm and normal heart sounds.   Pulmonary/Chest: Effort normal and breath sounds normal. Right breast exhibits no inverted nipple, no mass, no nipple discharge, no skin change and no tenderness. Left breast exhibits no inverted nipple, no mass, no nipple discharge, no skin change and  no tenderness.  Well healed incision at 9 o'clock right breast.   Abdominal: Soft. Bowel sounds are normal. There is no tenderness.  Lymphadenopathy:    She has no cervical adenopathy.    She has no axillary adenopathy.  Neurological: She is alert and oriented to person, place, and time.  Skin: Skin is warm and dry.    Data Reviewed  Bilateral mammograms and right breast ultrasound dated 05/19/2016 were reviewed. 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 BI-RADS-1.  Assessment    Benign breast exam. No evidence of recurrent phyllodes  tumor or persistent breast pain.    Plan    Annual screening mammograms are appropriate. The patient was offered the opportunity to have this completed through her primary care office but electric return to have these studies reviewed and clinical exams completed here.   Patient to return in one year bilateral screening mammogram.   HPI, Physical Exam, Assessment and Plan have been scribed under the direction and in the presence of Hervey Ard, MD.  Gaspar Cola, CMA I have completed the exam and reviewed the above documentation for accuracy and completeness.  I agree with the above.  Haematologist has been used and any errors in dictation or transcription are unintentional.  Hervey Ard, M.D., F.A.C.S.  Alicia Copeland 05/25/2016, 7:02 AM

## 2016-06-28 ENCOUNTER — Ambulatory Visit: Payer: BC Managed Care – PPO | Admitting: Unknown Physician Specialty

## 2016-07-05 ENCOUNTER — Other Ambulatory Visit: Payer: Self-pay | Admitting: Unknown Physician Specialty

## 2016-07-12 ENCOUNTER — Ambulatory Visit (INDEPENDENT_AMBULATORY_CARE_PROVIDER_SITE_OTHER): Payer: BC Managed Care – PPO | Admitting: Unknown Physician Specialty

## 2016-07-12 ENCOUNTER — Encounter: Payer: Self-pay | Admitting: Unknown Physician Specialty

## 2016-07-12 VITALS — BP 128/80 | HR 81 | Temp 98.7°F | Wt 140.2 lb

## 2016-07-12 DIAGNOSIS — I10 Essential (primary) hypertension: Secondary | ICD-10-CM | POA: Diagnosis not present

## 2016-07-12 DIAGNOSIS — M7022 Olecranon bursitis, left elbow: Secondary | ICD-10-CM

## 2016-07-12 DIAGNOSIS — M1711 Unilateral primary osteoarthritis, right knee: Secondary | ICD-10-CM | POA: Diagnosis not present

## 2016-07-12 NOTE — Assessment & Plan Note (Signed)
Stable, continue present medications.   

## 2016-07-12 NOTE — Progress Notes (Signed)
BP 128/80   Pulse 81   Temp 98.7 F (37.1 C)   Wt 140 lb 3.2 oz (63.6 kg)   SpO2 97%   BMI 22.63 kg/m    Subjective:    Patient ID: Alicia Copeland, female    DOB: 1960-02-28, 56 y.o.   MRN: [id number]  HPI: Alicia Copeland is a 56 y.o. female  Chief Complaint  Patient presents with  . Hypertension    6 month f/up   Hypertension Using medications without difficulty Average home BPs Not checking  No problems or lightheadedness No chest pain with exertion or shortness of breath No Edema  Elbow bursitis This is better and has resolved.    Knee OA Right knee.  This comes and goes.  States she has trouble with first getting up. She started Glucosamine but too many pills.    Family History  Problem Relation Age of Onset  . Diabetes Mother   . Breast cancer Mother   . Colon cancer Mother        breast cancer, lymphoma, greater than age 70  . Cancer Maternal Grandmother 33       colon cancer, greater than age 31  . Cancer Paternal Grandmother        breast cancer, greater than age 85  . Breast cancer Paternal Grandmother   . Hypertension Father   . Diabetes Father   . Heart attack Father   . Hypertension Brother   . Heart attack Maternal Grandfather      Relevant past medical, surgical, family and social history reviewed and updated as indicated. Interim medical history since our last visit reviewed. Allergies and medications reviewed and updated.  Review of Systems  Per HPI unless specifically indicated above     Objective:    BP 128/80   Pulse 81   Temp 98.7 F (37.1 C)   Wt 140 lb 3.2 oz (63.6 kg)   SpO2 97%   BMI 22.63 kg/m   Wt Readings from Last 3 Encounters:  07/12/16 140 lb 3.2 oz (63.6 kg)  05/24/16 145 lb (65.8 kg)  04/06/16 147 lb 12.8 oz (67 kg)    Physical Exam  Constitutional: She is oriented to person, place, and time. She appears well-developed and well-nourished. No distress.  HENT:  Head: Normocephalic and atraumatic.    Eyes: Conjunctivae and lids are normal. Right eye exhibits no discharge. Left eye exhibits no discharge. No scleral icterus.  Neck: Normal range of motion. Neck supple. No JVD present. Carotid bruit is not present.  Cardiovascular: Normal rate, regular rhythm and normal heart sounds.   Pulmonary/Chest: Effort normal and breath sounds normal.  Abdominal: Normal appearance. There is no splenomegaly or hepatomegaly.  Musculoskeletal: Normal range of motion.  Neurological: She is alert and oriented to person, place, and time.  Skin: Skin is warm, dry and intact. No rash noted. No pallor.  Psychiatric: She has a normal mood and affect. Her behavior is normal. Judgment and thought content normal.     Assessment & Plan:   Problem List Items Addressed This Visit      Unprioritized   Hypertension    Stable, continue present medications.        Osteoarthritis of right knee    Order knee x-ray.  Pt ed on Tylenol and Ibuprofen OTC.  Refer to Orthopedics if negative.        Relevant Orders   DG Knee Complete 4 Views Right    Other Visit Diagnoses  Olecranon bursitis of left elbow    -  Primary   resolved       Follow up plan: Return in about 6 months (around 01/11/2017).

## 2016-07-12 NOTE — Assessment & Plan Note (Addendum)
Order knee x-ray.  Pt ed on Tylenol and Ibuprofen OTC.  Refer to Orthopedics if negative.

## 2016-12-30 ENCOUNTER — Ambulatory Visit: Payer: BC Managed Care – PPO | Admitting: Unknown Physician Specialty

## 2016-12-30 ENCOUNTER — Encounter: Payer: Self-pay | Admitting: Unknown Physician Specialty

## 2016-12-30 VITALS — BP 126/78 | HR 74 | Temp 98.3°F | Ht 63.8 in | Wt 143.3 lb

## 2016-12-30 DIAGNOSIS — R928 Other abnormal and inconclusive findings on diagnostic imaging of breast: Secondary | ICD-10-CM | POA: Diagnosis not present

## 2016-12-30 DIAGNOSIS — Z0000 Encounter for general adult medical examination without abnormal findings: Secondary | ICD-10-CM | POA: Diagnosis not present

## 2016-12-30 DIAGNOSIS — I10 Essential (primary) hypertension: Secondary | ICD-10-CM | POA: Diagnosis not present

## 2016-12-30 DIAGNOSIS — N950 Postmenopausal bleeding: Secondary | ICD-10-CM

## 2016-12-30 DIAGNOSIS — Z1211 Encounter for screening for malignant neoplasm of colon: Secondary | ICD-10-CM | POA: Diagnosis not present

## 2016-12-30 MED ORDER — LISINOPRIL-HYDROCHLOROTHIAZIDE 20-12.5 MG PO TABS: 1.0000 | ORAL_TABLET | Freq: Every day | ORAL | 1 refills | Status: DC

## 2016-12-30 NOTE — Assessment & Plan Note (Signed)
Stable, continue present medications.   

## 2016-12-30 NOTE — Assessment & Plan Note (Signed)
Following with Dr. Fleet Contras

## 2016-12-30 NOTE — Progress Notes (Signed)
BP 126/78   Pulse 74   Temp 98.3 F (36.8 C) (Oral)   Ht 5' 3.8" (1.621 m)   Wt 143 lb 4.8 oz (65 kg)   LMP 10/07/2016 (Approximate)   SpO2 94%   BMI 24.75 kg/m    Subjective:    Patient ID: Alicia Copeland, female    DOB: 1960-03-02, 56 y.o.   MRN: [id number]  HPI: Alicia Copeland is a 56 y.o. female  Chief Complaint  Patient presents with  . Annual Exam   Hypertension Using medications without difficulty Average home BPs ; Checks occasionally.  Good numbers at home   No problems or lightheadedness No chest pain with exertion or shortness of breath No Edema  Menses New problem.  August 2018 had a menstrual period after not having one for 16 months.  Described as a regular period lasting 5 days.  No bleeding since  Relevant past medical, surgical, family and social history reviewed and updated as indicated. Interim medical history since our last visit reviewed. Allergies and medications reviewed and updated.  Review of Systems  Constitutional: Negative.   HENT: Negative.   Eyes: Negative.   Respiratory: Negative.   Cardiovascular: Negative.   Gastrointestinal: Negative.   Endocrine: Negative.   Genitourinary: Negative.   Musculoskeletal:       Still having right knee pain.  Hasn't yet had an x-ray  Allergic/Immunologic: Negative.   Neurological: Negative.   Hematological: Negative.   Psychiatric/Behavioral: Negative.     Per HPI unless specifically indicated above     Objective:    BP 126/78   Pulse 74   Temp 98.3 F (36.8 C) (Oral)   Ht 5' 3.8" (1.621 m)   Wt 143 lb 4.8 oz (65 kg)   LMP 10/07/2016 (Approximate)   SpO2 94%   BMI 24.75 kg/m   Wt Readings from Last 3 Encounters:  12/30/16 143 lb 4.8 oz (65 kg)  07/12/16 140 lb 3.2 oz (63.6 kg)  05/24/16 145 lb (65.8 kg)    Physical Exam  Constitutional: She is oriented to person, place, and time. She appears well-developed and well-nourished.  HENT:  Head: Normocephalic and atraumatic.    Eyes: Pupils are equal, round, and reactive to light. Right eye exhibits no discharge. Left eye exhibits no discharge. No scleral icterus.  Neck: Normal range of motion. Neck supple. Carotid bruit is not present. No thyromegaly present.  Cardiovascular: Normal rate, regular rhythm and normal heart sounds. Exam reveals no gallop and no friction rub.  No murmur heard. Pulmonary/Chest: Effort normal and breath sounds normal. No respiratory distress. She has no wheezes. She has no rales.  Abdominal: Soft. Bowel sounds are normal. There is no tenderness. There is no rebound.  Genitourinary: No breast swelling, tenderness or discharge.  Musculoskeletal: Normal range of motion.  Lymphadenopathy:    She has no cervical adenopathy.  Neurological: She is alert and oriented to person, place, and time.  Skin: Skin is warm, dry and intact. No rash noted.  Psychiatric: She has a normal mood and affect. Her speech is normal and behavior is normal. Judgment and thought content normal. Cognition and memory are normal.       Assessment & Plan:   Problem List Items Addressed This Visit      Unprioritized   Abnormal mammogram of right breast    Following with Dr. Fleet Contras      Relevant Orders   Ambulatory referral to Gastroenterology   Hypertension    Stable,  continue present medications.        Relevant Medications   lisinopril-hydrochlorothiazide (PRINZIDE,ZESTORETIC) 20-12.5 MG tablet   Post-menopausal bleeding    New problem.  Had one episode last summer of a regular period. Check Estrogen, FSH, LH.   Refer to gyn for further evaluation      Relevant Orders   [place]   LH   Estrogens, Total   Ambulatory referral to Gynecology    Other Visit Diagnoses    Annual physical exam    -  Primary   Relevant Orders   CBC with Differential/Platelet   Comprehensive metabolic panel   Lipid Panel w/o Chol/HDL Ratio   TSH   VITAMIN D 25 Hydroxy (Vit-D Deficiency, Fractures)   Colon cancer  screening           Follow up plan: Return in about 6 months (around 06/29/2017).

## 2016-12-30 NOTE — Assessment & Plan Note (Addendum)
New problem.  Had one episode last summer of a regular period. Check Estrogen, FSH, LH.   Refer to gyn for further evaluation

## 2017-01-01 LAB — ESTROGENS, TOTAL: ESTROGEN: 42 pg/mL

## 2017-01-01 LAB — LIPID PANEL W/O CHOL/HDL RATIO
Cholesterol, Total: 208 mg/dL — ABNORMAL HIGH (ref 100–199)
HDL: 45 mg/dL (ref >39)
LDL Calculated: 133 mg/dL — ABNORMAL HIGH (ref 0–99)
Triglycerides: 149 mg/dL (ref 0–149)
VLDL CHOLESTEROL CAL: 30 mg/dL (ref 5–40)

## 2017-01-01 LAB — COMPREHENSIVE METABOLIC PANEL
A/G RATIO: 1.8 (ref 1.2–2.2)
ALT: 11 IU/L (ref 0–32)
AST: 10 IU/L (ref 0–40)
Albumin: 4.3 g/dL (ref 3.5–5.5)
Alkaline Phosphatase: 81 IU/L (ref 39–117)
BUN/Creatinine Ratio: 23 (ref 9–23)
BUN: 19 mg/dL (ref 6–24)
Bilirubin Total: 0.4 mg/dL (ref 0.0–1.2)
CALCIUM: 9.6 mg/dL (ref 8.7–10.2)
CO2: 27 mmol/L (ref 20–29)
Chloride: 102 mmol/L (ref 96–106)
Creatinine, Ser: 0.83 mg/dL (ref 0.57–1.00)
GFR, EST AFRICAN AMERICAN: 92 mL/min/{1.73_m2} (ref >59)
GFR, EST NON AFRICAN AMERICAN: 80 mL/min/{1.73_m2} (ref >59)
Globulin, Total: 2.4 g/dL (ref 1.5–4.5)
Glucose: 94 mg/dL (ref 65–99)
Potassium: 4.4 mmol/L (ref 3.5–5.2)
Sodium: 143 mmol/L (ref 134–144)
TOTAL PROTEIN: 6.7 g/dL (ref 6.0–8.5)

## 2017-01-01 LAB — CBC WITH DIFFERENTIAL/PLATELET
BASOS: 0 %
Basophils Absolute: 0 10*3/uL (ref 0.0–0.2)
EOS (ABSOLUTE): 0.2 10*3/uL (ref 0.0–0.4)
EOS: 3 %
HEMOGLOBIN: 12.6 g/dL (ref 11.1–15.9)
Hematocrit: 38.6 % (ref 34.0–46.6)
IMMATURE GRANS (ABS): 0 10*3/uL (ref 0.0–0.1)
IMMATURE GRANULOCYTES: 0 %
LYMPHS: 33 %
Lymphocytes Absolute: 2.1 10*3/uL (ref 0.7–3.1)
MCH: 29 pg (ref 26.6–33.0)
MCHC: 32.6 g/dL (ref 31.5–35.7)
MCV: 89 fL (ref 79–97)
MONOCYTES: 5 %
Monocytes Absolute: 0.3 10*3/uL (ref 0.1–0.9)
NEUTROS PCT: 59 %
Neutrophils Absolute: 3.7 10*3/uL (ref 1.4–7.0)
PLATELETS: 221 10*3/uL (ref 150–379)
RBC: 4.35 x10E6/uL (ref 3.77–5.28)
RDW: 14.6 % (ref 12.3–15.4)
WBC: 6.3 10*3/uL (ref 3.4–10.8)

## 2017-01-01 LAB — LUTEINIZING HORMONE: LH: 44.2 m[IU]/mL

## 2017-01-01 LAB — VITAMIN D 25 HYDROXY (VIT D DEFICIENCY, FRACTURES): VIT D 25 HYDROXY: 32.8 ng/mL (ref 30.0–100.0)

## 2017-01-01 LAB — FOLLICLE STIMULATING HORMONE: FSH: 75.3 m[IU]/mL

## 2017-01-01 LAB — TSH: TSH: 1.69 u[IU]/mL (ref 0.450–4.500)

## 2017-01-02 ENCOUNTER — Other Ambulatory Visit: Payer: Self-pay

## 2017-01-02 ENCOUNTER — Telehealth: Payer: Self-pay | Admitting: Unknown Physician Specialty

## 2017-01-02 ENCOUNTER — Encounter: Payer: Self-pay | Admitting: Unknown Physician Specialty

## 2017-01-02 DIAGNOSIS — Z1211 Encounter for screening for malignant neoplasm of colon: Secondary | ICD-10-CM

## 2017-01-02 NOTE — Telephone Encounter (Signed)
Copied from [place]. Topic: Referral - Question >> Jan 02, 2017  8:39 AM Scherrie Gerlach wrote: Reason for CRM: [facility] called to advise the referral they received for pt was for abnormal pap.  They want you to delete or send back with the correct dx code.

## 2017-01-02 NOTE — Telephone Encounter (Signed)
Routing to referral coordinator.

## 2017-01-02 NOTE — Telephone Encounter (Signed)
Referral associated with wrong Dx removed.  New referral entered for colon cancer screening. Directed back to [facility].

## 2017-01-27 ENCOUNTER — Encounter: Payer: BC Managed Care – PPO | Admitting: Certified Nurse Midwife

## 2017-02-03 ENCOUNTER — Encounter: Payer: Self-pay | Admitting: Certified Nurse Midwife

## 2017-02-03 ENCOUNTER — Ambulatory Visit: Payer: BC Managed Care – PPO | Admitting: Certified Nurse Midwife

## 2017-02-03 VITALS — BP 140/79 | HR 77 | Ht 64.0 in | Wt 145.2 lb

## 2017-02-03 DIAGNOSIS — Z124 Encounter for screening for malignant neoplasm of cervix: Secondary | ICD-10-CM | POA: Diagnosis not present

## 2017-02-03 DIAGNOSIS — N939 Abnormal uterine and vaginal bleeding, unspecified: Secondary | ICD-10-CM

## 2017-02-03 NOTE — Progress Notes (Signed)
GYN ENCOUNTER NOTE  Subjective:       Alicia Copeland is a 56 y.o. G12P2002 female is here for gynecologic evaluation of the following issues:  1. Post menopausal bleeding.  She states that had her last period may 2017 then in endo of august beginning of September she had a period x 1 that was a normal flow and duration ( 5 days) . She has not had any bleeding since. She denies any precipitating factors to the bleeding ( ie trauma or intercourse) , she denies any pain or fever with the bleeding. She states that she recently had her annual physical exam and was sent for follow up from her primary care.    Gynecologic History No LMP recorded. Patient is postmenopausal. Contraception: none Last Pap:  07/22/2013 esults were: normal/ Last mammogram: 05/19/2016. Results were: normal. 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  . Hypertension 2009  . Lump or mass in breast 2011  . Special screening for malignant neoplasms, colon     Past Surgical History:  Procedure Laterality Date  . BREAST BIOPSY Right 2007   Fibroadenoma, core biopsy, ER/PR negative.done in [place]  . BREAST SURGERY Right 03/11/2010   Reexcision right breast lesion at the 10:00 position  with no evidence of residual flow in tumor  . BREAST SURGERY Right 02/04/2010   Right breast re-excision, no evidence of recurrent phyllodes tumor  . COLONOSCOPY  2012   Dr. Candace Cruise    Current Outpatient Medications on File Prior to Visit  Medication Sig Dispense Refill  . lisinopril-hydrochlorothiazide (PRINZIDE,ZESTORETIC) 20-12.5 MG tablet Take 1 tablet by mouth daily. 90 tablet 1  . Multiple Vitamin (MULTIVITAMIN) capsule Take 1 capsule by mouth daily.    . Omega-3 Fatty Acids (FISH OIL) 1000 MG CAPS Take 1,000 mg by mouth daily.     No current facility-administered medications on file prior to visit.     Allergies  Allergen Reactions  . Sulfa Antibiotics Hives and Other (See Comments)    Alters liver function. "breaks out in liver"    Social History   Socioeconomic History  . Marital status: Married    Spouse name: Not on file  . Number of children: Not on file  . Years of education: Not on file  . Highest education level: Not on file  Social Needs  . Financial resource strain: Not on file  . Food insecurity - worry: Not on file  . Food insecurity - inability: Not on file  . Transportation needs - medical: Not on file  . Transportation needs -  non-medical: Not on file  Occupational History  . Not on file  Tobacco Use  . Smoking status: Never Smoker  . Smokeless tobacco: Never Used  Substance and Sexual Activity  . Alcohol use: No  . Drug use: No  . Sexual activity: Yes    Birth control/protection: Post-menopausal  Other Topics Concern  . Not on file  Social History Narrative  . Not on file    Family History  Problem Relation Age of Onset  . Diabetes Mother   . Breast cancer Mother   . Colon cancer Mother        breast cancer, lymphoma, greater than age 41  . Cancer Maternal Grandmother 24       colon cancer, greater than age 62  . Cancer Paternal Grandmother        breast cancer, greater than age 62  . Breast cancer Paternal Grandmother   . Hypertension  Father   . Diabetes Father   . Heart attack Father   . Hypertension Brother   . Heart attack Maternal Grandfather     The following portions of the patient's history were reviewed and updated as appropriate: allergies, current medications, past family history, past medical history, past social history, past surgical history and problem list.  Review of Systems Review of Systems - Negative except as mentioned in HPI Review of Systems - General ROS: negative for - chills, fatigue, fever, hot flashes, malaise or night sweats Hematological and Lymphatic ROS: negative for - bleeding problems or swollen lymph nodes Gastrointestinal ROS: negative for - abdominal pain, blood in stools, change in bowel habits and nausea/vomiting Musculoskeletal ROS: negative for - joint pain, muscle pain or muscular weakness Genito-Urinary ROS: negative for -  dysmenorrhea, dyspareunia, dysuria, genital discharge, genital ulcers, hematuria, incontinence, irregular/heavy menses, nocturia or pelvic pain. Positive for post menopausal bleed x 1   Objective:   BP 140/79   Pulse 77   Ht 5\' 4"  (1.626 m)   Wt 145 lb 3.2 oz (65.9 kg)   BMI 24.92 kg/m  CONSTITUTIONAL: Well-developed, well-nourished female in no acute distress.  HENT:  Normocephalic, atraumatic.  NECK: Normal range of motion, supple SKIN: Skin is warm and dry. No rash noted. Not diaphoretic. No erythema. No pallor. [place]: Alert and oriented to person, place, and time.  PSYCHIATRIC: Normal mood and affect. Normal behavior. Normal judgment and thought content. CARDIOVASCULAR:Not Examined RESPIRATORY: Not Examined BREASTS: Not Examined ABDOMEN: Soft, non distended; Non tender.  No Organomegaly. PELVIC:  External Genitalia: Normal  BUS: Normal  Vagina: Normal  Cervix: Normal, contact bleeding with pap smear  Uterus: Normal size, shape,consistency, mobile  Adnexa: Normal  RV: Normal   Bladder: Nontender MUSCULOSKELETAL: Normal range of motion.  No tenderness.  No cyanosis, clubbing, or edema.   Assessment:   Post menopausal bleeding  Plan:   Pap smear today, will follow up with results. Pelvic ultrasound. Will follow up with results. Reviewed causes of abnormal uterine bleeding. Discussed endometrial biopsy if ultrasound shows thickened endometrium. She verbalizes understanding and agrees to plan.   Philip Aspen, CNM

## 2017-02-03 NOTE — Patient Instructions (Signed)
Postmenopausal Bleeding Postmenopausal bleeding is any bleeding after menopause. Menopause is when a woman's period stops. Any type of bleeding after menopause is concerning. It should be checked by your doctor. Any treatment will depend on the cause. Follow these instructions at home: Watch your condition for any changes.  Avoid the use of tampons and douches as told by your doctor.  Change your pads often.  Get regular pelvic exams and Pap tests.  Keep all appointments for tests as told by your doctor.  Contact a doctor if:  Your bleeding lasts for more than 1 week.  You have belly (abdominal) pain.  You have bleeding after sex (intercourse). Get help right away if:  You have a fever, chills, a headache, dizziness, muscle aches, and bleeding.  You have strong pain with bleeding.  You have clumps of blood (blood clots) coming from your vagina.  You have bleeding and need more than 1 pad an hour.  You feel like you are going to pass out (faint). This information is not intended to replace advice given to you by your health care provider. Make sure you discuss any questions you have with your health care provider. Document Released: 11/03/2007 Document Revised: 07/02/2015 Document Reviewed: 08/23/2012 Elsevier Interactive Patient Education  2017 Elsevier Inc.  

## 2017-02-03 NOTE — Addendum Note (Signed)
Addended by: Elouise Munroe on: 02/03/2017 11:56 AM   Modules accepted: Orders

## 2017-02-08 ENCOUNTER — Ambulatory Visit (INDEPENDENT_AMBULATORY_CARE_PROVIDER_SITE_OTHER): Payer: BC Managed Care – PPO

## 2017-02-08 DIAGNOSIS — N939 Abnormal uterine and vaginal bleeding, unspecified: Secondary | ICD-10-CM | POA: Diagnosis not present

## 2017-02-10 LAB — IGP, COBASHPV16/18
HPV 16: NEGATIVE
HPV 18: NEGATIVE
HPV other hr types: NEGATIVE
PAP SMEAR COMMENT: 0

## 2017-02-13 ENCOUNTER — Telehealth: Payer: Self-pay | Admitting: Certified Nurse Midwife

## 2017-02-13 NOTE — Telephone Encounter (Signed)
Spoke with [name] regarding pap smear results and follow up plan of care. She will make an appointment with Melody for as soon as possible.   Philip Aspen, CNM

## 2017-02-22 ENCOUNTER — Encounter: Payer: BC Managed Care – PPO | Admitting: Obstetrics and Gynecology

## 2017-03-21 ENCOUNTER — Other Ambulatory Visit: Payer: Self-pay

## 2017-04-10 ENCOUNTER — Other Ambulatory Visit: Payer: Self-pay

## 2017-04-10 DIAGNOSIS — Z1231 Encounter for screening mammogram for malignant neoplasm of breast: Secondary | ICD-10-CM

## 2017-05-23 ENCOUNTER — Other Ambulatory Visit: Payer: Self-pay | Admitting: General Surgery

## 2017-05-23 ENCOUNTER — Ambulatory Visit
Admission: RE | Admit: 2017-05-23 | Discharge: 2017-05-23 | Disposition: A | Discharge status: Home / Self Care | Payer: BC Managed Care – PPO | Source: Ambulatory Visit | Attending: General Surgery | Admitting: General Surgery

## 2017-05-23 DIAGNOSIS — Z1231 Encounter for screening mammogram for malignant neoplasm of breast: Secondary | ICD-10-CM

## 2017-05-30 ENCOUNTER — Encounter: Payer: Self-pay | Admitting: General Surgery

## 2017-05-30 ENCOUNTER — Ambulatory Visit: Payer: BC Managed Care – PPO | Admitting: General Surgery

## 2017-05-30 VITALS — BP 128/78 | HR 102 | Resp 12 | Ht 64.0 in | Wt 146.0 lb

## 2017-05-30 DIAGNOSIS — D241 Benign neoplasm of right breast: Secondary | ICD-10-CM

## 2017-05-30 DIAGNOSIS — Z1231 Encounter for screening mammogram for malignant neoplasm of breast: Secondary | ICD-10-CM | POA: Insufficient documentation

## 2017-05-30 NOTE — Progress Notes (Signed)
Patient ID: Alicia Copeland, female   DOB: 1960/03/03, 57 y.o.   MRN: [id number]  Chief Complaint  Patient presents with  . Follow-up    HPI Alicia Copeland is a 57 y.o. female. who presents for her annual follow up and a breast evaluation. The most recent mammogram was done on 05-24-17.  Patient does perform regular self breast checks and gets regular mammograms done.  No new breast issues.  She states she has been doing fairly well since her mother passing.  HPI    Past Medical History:  Diagnosis Date  . 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  . Hypertension 2009  . Lump or mass in breast 2011  . Special screening for malignant neoplasms, colon     Past Surgical History:  Procedure Laterality Date  . BREAST BIOPSY Right 2007   Fibroadenoma, core biopsy, ER/PR negative.done in [place]  . BREAST SURGERY Right 03/11/2010   Reexcision right breast lesion at the 10:00 position with no evidence of residual flow in tumor  . BREAST SURGERY Right 02/04/2010   Right breast re-excision, no evidence of recurrent phyllodes tumor  . COLONOSCOPY  2012   Dr. Candace Cruise    Family History  Problem Relation Age of Onset  . Diabetes Mother   . Breast cancer Mother   . Colon cancer Mother        breast cancer, lymphoma, greater than age 61  . Cancer Maternal Grandmother 71       colon cancer, greater than age 22  . Cancer Paternal Grandmother        breast cancer, greater than age 57  . Breast cancer Paternal Grandmother   . Hypertension Father   . Diabetes Father   . Heart attack Father   . Hypertension Brother   . Heart attack Maternal Grandfather     Social History Social History   Tobacco Use  . Smoking status: Never Smoker  . Smokeless tobacco: Never Used  Substance Use  Topics  . Alcohol use: No  . Drug use: No    Allergies  Allergen Reactions  . Sulfa Antibiotics Hives and Other (See Comments)    Alters liver function. "breaks out in liver"    Current Outpatient Medications  Medication Sig Dispense Refill  . lisinopril-hydrochlorothiazide (PRINZIDE,ZESTORETIC) 20-12.5 MG tablet Take 1 tablet by mouth daily. 90 tablet 1  . Multiple Vitamin (MULTIVITAMIN) capsule Take 1 capsule by mouth daily.    . Omega-3 Fatty Acids (FISH OIL) 1000 MG CAPS Take 1,000 mg by mouth daily.     No current facility-administered medications for this visit.     Review of Systems Review of Systems  Constitutional: Negative.   Respiratory: Negative.   Cardiovascular: Negative.     Blood pressure 128/78, pulse (!) 102, resp. rate 12, height 5\' 4"  (1.626 m), weight 146 lb (66.2 kg), last menstrual period 10/07/2016, SpO2 98 %.  Physical Exam Physical Exam  Constitutional: She is oriented to person, place, and time. She appears well-developed and well-nourished.  HENT:  Mouth/Throat: Oropharynx is clear and moist.  Eyes: Conjunctivae are normal. No scleral icterus.  Neck: Neck supple.  Cardiovascular: Normal rate, regular rhythm and normal heart sounds.  Pulmonary/Chest: Effort normal and breath sounds normal. Right breast exhibits no inverted nipple, no mass, no  nipple discharge, no skin change and no tenderness. Left breast exhibits no inverted nipple, no mass, no nipple discharge, no skin change and no tenderness.  Lymphadenopathy:    She has no cervical adenopathy.    She has no axillary adenopathy.  Neurological: She is alert and oriented to person, place, and time.  Skin: Skin is warm and dry.  Skin tag 8 mm left ant shoulder 9 mm skin tag left lower back and left facial cheek  Psychiatric: Her behavior is normal.    Data Reviewed Bilateral screening mammograms dated May 24, 2017 were reviewed.  BI-RADS-1.   Assessment    Stable breast  exam.  Facial skin cyst left cheek.  As    Plan    Patient will be asked to return to the office in one year with a bilateral screening mammogram.  The patient was offered the opportunity for plastic surgery referral for the cheek lesion or this can be done in office here.   She was notified that there would be a scar from the area of excision. The symptomatic skin lesions can be removed as she desires.       HPI, Physical Exam, Assessment and Plan have been scribed under the direction and in the presence of Robert Bellow, MD. Karie Fetch, RN  I have completed the exam and reviewed the above documentation for accuracy and completeness.  I agree with the above.  Haematologist has been used and any errors in dictation or transcription are unintentional.  Hervey Ard, M.D., F.A.C.S.   Alicia Copeland Alicia Copeland 05/30/2017, 9:25 PM

## 2017-05-30 NOTE — Patient Instructions (Addendum)
The patient is aware to call back for any questions or new concerns. Patient will be asked to return to the office in one year with a bilateral screening mammogram.  

## 2017-06-30 ENCOUNTER — Ambulatory Visit: Payer: BC Managed Care – PPO | Admitting: Unknown Physician Specialty

## 2017-06-30 ENCOUNTER — Encounter: Payer: Self-pay | Admitting: Unknown Physician Specialty

## 2017-06-30 DIAGNOSIS — I10 Essential (primary) hypertension: Secondary | ICD-10-CM

## 2017-06-30 DIAGNOSIS — R87619 Unspecified abnormal cytological findings in specimens from cervix uteri: Secondary | ICD-10-CM | POA: Diagnosis not present

## 2017-06-30 NOTE — Progress Notes (Signed)
   BP 109/72   Pulse 77   Wt 148 lb (67.1 kg)   LMP 10/07/2016 (Approximate)   SpO2 98%   BMI 25.40 kg/m    Subjective:    Patient ID: Alicia Copeland, female    DOB: 01-02-1961, 57 y.o.   MRN: [id number]  HPI: Alicia Copeland is a 57 y.o. female  Chief Complaint  Patient presents with  . Follow-up  . Hypertension   Hypertension Using medications without difficulty Average home BPs Not checking   No problems or lightheadedness No chest pain with exertion or shortness of breath No Edema  AGUS pap Pt with AGUS pap.  Followed up x 1 but needs another f/u and is having trouble getting a f/u.    Relevant past medical, surgical, family and social history reviewed and updated as indicated. Interim medical history since our last visit reviewed. Allergies and medications reviewed and updated.  Review of Systems  Per HPI unless specifically indicated above     Objective:    BP 109/72   Pulse 77   Wt 148 lb (67.1 kg)   LMP 10/07/2016 (Approximate)   SpO2 98%   BMI 25.40 kg/m   Wt Readings from Last 3 Encounters:  06/30/17 148 lb (67.1 kg)  05/30/17 146 lb (66.2 kg)  02/03/17 145 lb 3.2 oz (65.9 kg)    Physical Exam  Constitutional: She is oriented to person, place, and time. She appears well-developed and well-nourished. No distress.  HENT:  Head: Normocephalic and atraumatic.  Eyes: Conjunctivae and lids are normal. Right eye exhibits no discharge. Left eye exhibits no discharge. No scleral icterus.  Neck: Normal range of motion. Neck supple. No JVD present. Carotid bruit is not present.  Cardiovascular: Normal rate, regular rhythm and normal heart sounds.  Pulmonary/Chest: Effort normal and breath sounds normal.  Abdominal: Normal appearance. There is no splenomegaly or hepatomegaly.  Musculoskeletal: Normal range of motion.  Neurological: She is alert and oriented to person, place, and time.  Skin: Skin is warm, dry and intact. No rash noted. No pallor.    Psychiatric: She has a normal mood and affect. Her behavior is normal. Judgment and thought content normal.    Results for orders placed or performed in visit on 02/03/17  IGP, cobasHPV16/18  Result Value Ref Range   DIAGNOSIS: Comment (A)    Specimen adequacy: Comment    Clinician Provided ICD10 Comment    Performed by: Comment    Electronically signed by: Comment    PAP Smear Comment .    PATHOLOGIST PROVIDED ICD10: Comment    Note: Comment    Test Methodology Comment    HPV other hr types Negative Negative   HPV 16 Negative Negative   HPV 18 Negative Negative      Assessment & Plan:   Problem List Items Addressed This Visit      Unprioritized   Atypical glandular cells of undetermined significance (AGUS) on cervical Pap smear    Discussed need for f/u.  She will contact her CNM and let me know if she needs a referral.        Hypertension    Stable, continue present medications.            Follow up plan: Return in about 6 months (around 12/31/2017) for physical.

## 2017-06-30 NOTE — Assessment & Plan Note (Signed)
Stable, continue present medications.   

## 2017-06-30 NOTE — Assessment & Plan Note (Signed)
Discussed need for f/u.  She will contact her CNM and let me know if she needs a referral.

## 2017-07-05 ENCOUNTER — Other Ambulatory Visit: Payer: Self-pay | Admitting: Unknown Physician Specialty

## 2017-07-05 ENCOUNTER — Telehealth: Payer: Self-pay | Admitting: Certified Nurse Midwife

## 2017-07-05 ENCOUNTER — Telehealth: Payer: Self-pay

## 2017-07-05 NOTE — Telephone Encounter (Signed)
The patient called and stated that she would like to speak with a nurse if possible in regards to her not being contacted back in regards to her needing a procedure done when Melody was out. The patient would like a call back today after 3 pm if possible. Please advise.

## 2017-07-05 NOTE — Telephone Encounter (Signed)
Returned pts call- she had a abnormal pap smear in Jan 2019. She then had an ultrasound that was reviewed by Dr Marcelline Mates.She had  PMB X1. Pt had a fibroid on U/S. She has not had any bleeding since. She was encouraged to make an appointment with [facility] to discuss a possible colposcopy. 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 Pt expressed agreement.

## 2018-01-20 ENCOUNTER — Other Ambulatory Visit: Payer: Self-pay | Admitting: Unknown Physician Specialty

## 2018-01-22 NOTE — Telephone Encounter (Signed)
Will need appointment for next refill

## 2018-01-22 NOTE — Telephone Encounter (Signed)
Attempted to contact patient regarding her refill for lisinopril-hydrochlorothiazide 20-12.5 mg. The phone rang and then was disconnected. Pt needs to have a follow up appointment regarding her b/p and medication. And labs overdue, > 180 days. Last labs 2018.

## 2018-02-19 ENCOUNTER — Ambulatory Visit: Payer: BC Managed Care – PPO | Admitting: Family Medicine

## 2018-02-19 VITALS — BP 133/76 | HR 86 | Temp 99.4°F | Ht 64.0 in | Wt 148.0 lb

## 2018-02-19 DIAGNOSIS — M1711 Unilateral primary osteoarthritis, right knee: Secondary | ICD-10-CM

## 2018-02-19 MED ORDER — DICLOFENAC SODIUM 1 % TD GEL: 2.0000 g | Freq: Four times a day (QID) | TRANSDERMAL | 1 refills | Status: DC

## 2018-02-19 NOTE — Progress Notes (Signed)
BP 133/76 (BP Location: Left Arm, Patient Position: Sitting, Cuff Size: Normal)   Pulse 86   Temp 99.4 F (37.4 C)   Ht 5\' 4"  (1.626 m)   Wt 148 lb (67.1 kg)   LMP 10/07/2016 (Approximate)   SpO2 97%   BMI 25.40 kg/m    Subjective:    Patient ID: Alicia Copeland, female    DOB: 12-23-1960, 58 y.o.   MRN: [id number]  HPI: Alicia Copeland is a 58 y.o. female  Chief Complaint  Patient presents with  . Knee Pain    right X 2 years, getting worse, swelling, heat, difficulity walking   Here today with over 2 years of right knee pain, swelling, popping that is worsening over time. Dx'd with OA which she has been treating with prn NSAIDs which seemed to help initially but now is having significant issues going up and down stairs, walking long distances, bending down, etc. No injury, instability, fevers, rashes. Has never had imaging of the knee in the past.    Relevant past medical, surgical, family and social history reviewed and updated as indicated. Interim medical history since our last visit reviewed. Allergies and medications reviewed and updated.  Review of Systems  Per HPI unless specifically indicated above     Objective:    BP 133/76 (BP Location: Left Arm, Patient Position: Sitting, Cuff Size: Normal)   Pulse 86   Temp 99.4 F (37.4 C)   Ht 5\' 4"  (1.626 m)   Wt 148 lb (67.1 kg)   LMP 10/07/2016 (Approximate)   SpO2 97%   BMI 25.40 kg/m   Wt Readings from Last 3 Encounters:  02/19/18 148 lb (67.1 kg)  06/30/17 148 lb (67.1 kg)  05/30/17 146 lb (66.2 kg)    Physical Exam Vitals signs and nursing note reviewed.  Constitutional:      Appearance: Normal appearance. She is not ill-appearing.  HENT:     Head: Atraumatic.  Eyes:     Extraocular Movements: Extraocular movements intact.     Conjunctiva/sclera: Conjunctivae normal.  Neck:     Musculoskeletal: Normal range of motion and neck supple.  Cardiovascular:     Rate and Rhythm: Normal rate and regular  rhythm.     Heart sounds: Normal heart sounds.  Pulmonary:     Effort: Pulmonary effort is normal.     Breath sounds: Normal breath sounds.  Musculoskeletal: Normal range of motion.     Comments: Moderate crepitus right knee Small effusion right knee TTP b/l joint lines right knee  Skin:    General: Skin is warm and dry.     Findings: No erythema.  Neurological:     Mental Status: She is alert and oriented to person, place, and time.  Psychiatric:        Mood and Affect: Mood normal.        Thought Content: Thought content normal.        Judgment: Judgment normal.     Results for orders placed or performed in visit on 02/03/17  IGP, cobasHPV16/18  Result Value Ref Range   DIAGNOSIS: Comment (A)    Specimen adequacy: Comment    Clinician Provided ICD10 Comment    Performed by: Comment    Electronically signed by: Comment    PAP Smear Comment .    PATHOLOGIST PROVIDED ICD10: Comment    Note: Comment    Test Methodology Comment    HPV other hr types Negative Negative   HPV 16  Negative Negative   HPV 18 Negative Negative      Assessment & Plan:   Problem List Items Addressed This Visit      Musculoskeletal and Integument   Osteoarthritis of right knee - Primary    Will obtain knee x-ray and start diclofenac gel, ice, compression wrap, epsom salt soaks. Suspect will likely need orthopedic referral in the near future given severity      Relevant Orders   DG Knee Complete 4 Views Right (Completed)       Follow up plan: Return if symptoms worsen or fail to improve.

## 2018-02-19 NOTE — Patient Instructions (Signed)
Diclofenac gel - if your insurance does not cover the medicine, you can have me send it to [facility] and use your goodrx card to get it for about 13 dollars.   You can go get your x-ray at any time at the [facility] center

## 2018-02-20 ENCOUNTER — Telehealth: Payer: Self-pay

## 2018-02-20 NOTE — Telephone Encounter (Signed)
PA for Diclofenac Gel initiated via Cover My Meds. Key: [id number]

## 2018-02-21 ENCOUNTER — Ambulatory Visit
Admission: RE | Admit: 2018-02-21 | Discharge: 2018-02-21 | Disposition: A | Discharge status: Home / Self Care | Payer: BC Managed Care – PPO | Attending: Family Medicine | Admitting: Family Medicine

## 2018-02-21 ENCOUNTER — Ambulatory Visit
Admission: RE | Admit: 2018-02-21 | Discharge: 2018-02-21 | Disposition: A | Discharge status: Home / Self Care | Payer: BC Managed Care – PPO | Source: Ambulatory Visit | Attending: Family Medicine | Admitting: Family Medicine

## 2018-02-21 DIAGNOSIS — M1711 Unilateral primary osteoarthritis, right knee: Secondary | ICD-10-CM | POA: Diagnosis not present

## 2018-02-21 NOTE — Telephone Encounter (Signed)
PA was denied. Routing to provider so she is aware of denial.

## 2018-02-22 ENCOUNTER — Telehealth: Payer: Self-pay

## 2018-02-22 NOTE — Telephone Encounter (Signed)
Patient called to get more explanation on the knee xray result given to her. She says she has more questions and asked me to go over the results again. The results were given, she verbalized understanding. The answer given to her questions as noted below and she says she has a few more questions. She asked at what point will she need an MRI, surgery and says it's being left up to her to determine going to orthopedics. I advised once she has tried all of the recommended treatments by Apolonio Schneiders and the pain is still not relieved, call us back and let us know and a referral will be placed at that time. I advised the orthopedic doctor is the specialist who will decide further treatment. She verbalized understanding.

## 2018-02-22 NOTE — Telephone Encounter (Signed)
-----   Message from Volney American, [place] sent at 02/22/2018  2:45 PM EST ----- As discussed during office visit, if pain not controlled with diclofenac gel, ice, soaks then she can let us know she would like a referral to orthopedics for further management ----- Message ----- From: Amada Kingfisher, CMA Sent: 02/22/2018   1:33 PM EST To: Volney American, PA-C  Message relayed to patient. Verbalized understanding. Patient asking what the next steps are with this. Advised that pain management with prescribed gel. Patient stated, "But will I need an MRI? Surgery? What's next". Please advise.

## 2018-02-22 NOTE — Assessment & Plan Note (Signed)
Will obtain knee x-ray and start diclofenac gel, ice, compression wrap, epsom salt soaks. Suspect will likely need orthopedic referral in the near future given severity

## 2018-04-10 ENCOUNTER — Other Ambulatory Visit: Payer: Self-pay

## 2018-04-10 DIAGNOSIS — Z1231 Encounter for screening mammogram for malignant neoplasm of breast: Secondary | ICD-10-CM

## 2018-05-31 ENCOUNTER — Ambulatory Visit: Payer: BC Managed Care – PPO | Admitting: General Surgery

## 2018-07-22 ENCOUNTER — Other Ambulatory Visit: Payer: Self-pay | Admitting: Nurse Practitioner

## 2018-07-23 NOTE — Telephone Encounter (Signed)
Requested medication (s) are due for refill today: yes  Requested medication (s) are on the active medication list: yes  Last refill:  01/22/18#90 with 1 refill  Future visit scheduled: No  Notes to clinic:  Unable to refill per protocol. Last CMP noted to be on 12/30/16. No future visit scheduled.    Requested Prescriptions  Pending Prescriptions Disp Refills   lisinopril-hydrochlorothiazide (ZESTORETIC) 20-12.5 MG tablet [Pharmacy Med Name: LISINOPRIL-HCTZ 20-12.5 MG TAB] 90 tablet 1    Sig: TAKE 1 TABLET BY MOUTH EVERY DAY     Cardiovascular:  ACEI + Diuretic Combos Failed - 07/22/2018  9:48 AM      Failed - Na in normal range and within 180 days    Sodium  Date Value Ref Range Status  12/30/2016 143 134 - 144 mmol/L Final         Failed - K in normal range and within 180 days    Potassium  Date Value Ref Range Status  12/30/2016 4.4 3.5 - 5.2 mmol/L Final         Failed - Cr in normal range and within 180 days    Creatinine, Ser  Date Value Ref Range Status  12/30/2016 0.83 0.57 - 1.00 mg/dL Final         Failed - Ca in normal range and within 180 days    Calcium  Date Value Ref Range Status  12/30/2016 9.6 8.7 - 10.2 mg/dL Final         Passed - Patient is not pregnant      Passed - Last BP in normal range    BP Readings from Last 1 Encounters:  02/19/18 133/76         Passed - Valid encounter within last 6 months    Recent Outpatient Visits          5 months ago Primary osteoarthritis of right knee   [facility] Volney American, [place]   1 year ago Essential hypertension   [facility] Kathrine Haddock, NP   1 year ago Annual physical exam   [facility] Kathrine Haddock, NP   2 years ago Olecranon bursitis of left elbow   [facility] Kathrine Haddock, NP   2 years ago Olecranon bursitis of left elbow   [facility] Kathrine Haddock, NP

## 2018-10-19 ENCOUNTER — Other Ambulatory Visit: Payer: Self-pay | Admitting: Nurse Practitioner

## 2018-10-19 NOTE — Telephone Encounter (Signed)
Requested medication (s) are due for refill today: yes   Requested medication (s) are on the active medication list: yes  Last refill:  07/23/2018  Future visit scheduled: no  Notes to clinic: review for refill   Requested Prescriptions  Pending Prescriptions Disp Refills   lisinopril-hydrochlorothiazide (ZESTORETIC) 20-12.5 MG tablet [Pharmacy Med Name: LISINOPRIL-HCTZ 20-12.5 MG TAB] 90 tablet 0    Sig: Take 1 tablet by mouth daily. For further refills needs to see provider.     Cardiovascular:  ACEI + Diuretic Combos Failed - 10/19/2018 11:30 AM      Failed - Na in normal range and within 180 days    Sodium  Date Value Ref Range Status  12/30/2016 143 134 - 144 mmol/L Final         Failed - K in normal range and within 180 days    Potassium  Date Value Ref Range Status  12/30/2016 4.4 3.5 - 5.2 mmol/L Final         Failed - Cr in normal range and within 180 days    Creatinine, Ser  Date Value Ref Range Status  12/30/2016 0.83 0.57 - 1.00 mg/dL Final         Failed - Ca in normal range and within 180 days    Calcium  Date Value Ref Range Status  12/30/2016 9.6 8.7 - 10.2 mg/dL Final         Failed - Valid encounter within last 6 months    Recent Outpatient Visits          8 months ago Primary osteoarthritis of right knee   [facility] Volney American, [place]   1 year ago Essential hypertension   [place] Kathrine Haddock, NP   1 year ago Annual physical exam   [facility] Kathrine Haddock, NP   2 years ago Olecranon bursitis of left elbow   [facility] Kathrine Haddock, NP   2 years ago Olecranon bursitis of left elbow   [facility] Kathrine Haddock, NP             Passed - Patient is not pregnant      Passed - Last BP in normal range    BP Readings from Last 1 Encounters:  02/19/18 133/76

## 2018-10-19 NOTE — Telephone Encounter (Signed)
Routing to last provider seen.

## 2018-10-24 ENCOUNTER — Other Ambulatory Visit: Payer: BC Managed Care – PPO

## 2018-10-24 ENCOUNTER — Other Ambulatory Visit: Payer: Self-pay

## 2018-10-24 ENCOUNTER — Ambulatory Visit: Payer: BC Managed Care – PPO | Admitting: Family Medicine

## 2018-10-24 DIAGNOSIS — R300 Dysuria: Secondary | ICD-10-CM

## 2018-10-25 ENCOUNTER — Ambulatory Visit (INDEPENDENT_AMBULATORY_CARE_PROVIDER_SITE_OTHER): Payer: BC Managed Care – PPO | Admitting: Nurse Practitioner

## 2018-10-25 ENCOUNTER — Encounter: Payer: Self-pay | Admitting: Nurse Practitioner

## 2018-10-25 DIAGNOSIS — N3001 Acute cystitis with hematuria: Secondary | ICD-10-CM | POA: Insufficient documentation

## 2018-10-25 MED ORDER — NITROFURANTOIN MONOHYD MACRO 100 MG PO CAPS: 100.0000 mg | ORAL_CAPSULE | Freq: Two times a day (BID) | ORAL | 0 refills | Status: AC

## 2018-10-25 NOTE — Assessment & Plan Note (Signed)
Acute, UA noting 1+ RBC dipstick, 1+ LEUK, few bacteria and 6-10 LEUK microscopic.  At this time based on labs and physical exam will treat.  Script sent for [employer].  Recommend continue increased hydration and may use AZO or over the counter cranberry or Vit C tablets for comfort.  Return to office for continued or worsening symptoms.

## 2018-10-25 NOTE — Progress Notes (Signed)
LMP 10/07/2016 (Approximate)    Subjective:    Patient ID: Alicia Copeland, female    DOB: [birth year], 58 y.o.   MRN: [id number]  HPI: Alicia Copeland is a 58 y.o. female  Chief Complaint  Patient presents with  . Urinary Tract Infection    pt states she has been having an odor and urinary urgency for a few weeks    . This visit was completed via Doximity due to the restrictions of the COVID-19 pandemic. All issues as above were discussed and addressed. Physical exam was done as above through visual confirmation on Doximity. If it was felt that the patient should be evaluated in the office, they were directed there. The patient verbally consented to this visit. . Location of the patient: home . Location of the provider: home . Those involved with this call:  . Provider: Marnee Guarneri, DNP . CMA: Yvonna Alanis, CMA . Front Desk/Registration: Jill Side  . Time spent on call: 15 minutes with patient face to face via video conference. More than 50% of this time was spent in counseling and coordination of care. 10 minutes total spent in review of patient's record and preparation of their chart.  . I verified patient identity using two factors (patient name and date of birth). Patient consents verbally to being seen via telemedicine visit today.    URINARY SYMPTOMS Has been prone to UTI in past, has not had one in awhile.  Has current odor to urine and burning in morning.   Dysuria: burning Urinary frequency: yes Urgency: yes Small volume voids: yes Symptom severity: yes Urinary incontinence: no Foul odor: yes Hematuria: no Abdominal pain: no Back pain: no Suprapubic pain/pressure: yes Flank pain: no Fever:  no Vomiting: no Relief with cranberry juice: none Relief with pyridium: none Status: stable Previous urinary tract infection: yes Recurrent urinary tract infection: no Sexual activity: No sexually active/monogomous/practicing safe sex History of sexually  transmitted disease: no Treatments attempted: increasing fluids   Relevant past medical, surgical, family and social history reviewed and updated as indicated. Interim medical history since our last visit reviewed. Allergies and medications reviewed and updated.  Review of Systems  Constitutional: Negative for activity change, appetite change, diaphoresis, fatigue and fever.  Respiratory: Negative for cough, chest tightness and shortness of breath.   Cardiovascular: Negative for chest pain, palpitations and leg swelling.  Gastrointestinal: Negative for abdominal distention, abdominal pain, constipation, diarrhea, nausea and vomiting.  Genitourinary: Positive for dysuria, frequency and urgency. Negative for flank pain, hematuria and vaginal discharge.  Musculoskeletal: Negative for back pain.  Psychiatric/Behavioral: Negative.     Per HPI unless specifically indicated above     Objective:    LMP 10/07/2016 (Approximate)   Wt Readings from Last 3 Encounters:  02/19/18 148 lb (67.1 kg)  06/30/17 148 lb (67.1 kg)  05/30/17 146 lb (66.2 kg)    Physical Exam Vitals signs and nursing note reviewed.  Constitutional:      General: She is awake. She is not in acute distress.    Appearance: She is well-developed. She is not ill-appearing.  HENT:     Head: Normocephalic.     Right Ear: Hearing normal.     Left Ear: Hearing normal.  Eyes:     General: Lids are normal.        Right eye: No discharge.        Left eye: No discharge.     Conjunctiva/sclera: Conjunctivae normal.  Neck:  Musculoskeletal: Normal range of motion.  Pulmonary:     Effort: Pulmonary effort is normal. No accessory muscle usage or respiratory distress.     Comments: Unable to auscultate due to virtual exam only.  No SOB with talking noted. Abdominal:     Tenderness: There is abdominal tenderness in the suprapubic area.     Comments: Unable to auscultate due to virtual exam only.  Patient self palpated and  noted mild suprapubic tenderness.  Neurological:     Mental Status: She is alert and oriented to person, place, and time.  Psychiatric:        Attention and Perception: Attention normal.        Mood and Affect: Mood normal.        Behavior: Behavior normal. Behavior is cooperative.        Thought Content: Thought content normal.        Judgment: Judgment normal.     Results for orders placed or performed in visit on 10/24/18  Microscopic Examination   URINE  Result Value Ref Range   WBC, UA 6-10 (A) 0 - 5 /hpf   RBC None seen 0 - 2 /hpf   Epithelial Cells (non renal) 0-10 0 - 10 /hpf   Bacteria, UA Few (A) None seen/Few  Urine Culture, Reflex   URINE  Result Value Ref Range   Urine Culture, Routine WILL FOLLOW   UA/M w/rflx Culture, Routine   Specimen: Urine   URINE  Result Value Ref Range   Specific Gravity, UA 1.010 1.005 - 1.030   pH, UA 6.5 5.0 - 7.5   Color, UA Yellow Yellow   Appearance Ur Clear Clear   Leukocytes,UA 1+ (A) Negative   Protein,UA Negative Negative/Trace   Glucose, UA Negative Negative   Ketones, UA Negative Negative   RBC, UA 1+ (A) Negative   Bilirubin, UA Negative Negative   Urobilinogen, Ur 0.2 0.2 - 1.0 mg/dL   Nitrite, UA Negative Negative   Microscopic Examination See below:    Urinalysis Reflex Comment       Assessment & Plan:   Problem List Items Addressed This Visit      Genitourinary   Acute cystitis with hematuria - Primary    Acute, UA noting 1+ RBC dipstick, 1+ LEUK, few bacteria and 6-10 LEUK microscopic.  At this time based on labs and physical exam will treat.  Script sent for [employer].  Recommend continue increased hydration and may use AZO or over the counter cranberry or Vit C tablets for comfort.  Return to office for continued or worsening symptoms.         I discussed the assessment and treatment plan with the patient. The patient was provided an opportunity to ask questions and all were answered. The patient agreed  with the plan and demonstrated an understanding of the instructions.   The patient was advised to call back or seek an in-person evaluation if the symptoms worsen or if the condition fails to improve as anticipated.   I provided 15 minutes of time during this encounter.  Follow up plan: Return if symptoms worsen or fail to improve.

## 2018-10-25 NOTE — Patient Instructions (Signed)
Urinary Tract Infection, Adult A urinary tract infection (UTI) is an infection of any part of the urinary tract. The urinary tract includes:  The kidneys.  The ureters.  The bladder.  The urethra. These organs make, store, and get rid of pee (urine) in the body. What are the causes? This is caused by germs (bacteria) in your genital area. These germs grow and cause swelling (inflammation) of your urinary tract. What increases the risk? You are more likely to develop this condition if:  You have a small, thin tube (catheter) to drain pee.  You cannot control when you pee or poop (incontinence).  You are female, and: ? You use these methods to prevent pregnancy: ? A medicine that kills sperm (spermicide). ? A device that blocks sperm (diaphragm). ? You have low levels of a female hormone (estrogen). ? You are pregnant.  You have genes that add to your risk.  You are sexually active.  You take antibiotic medicines.  You have trouble peeing because of: ? A prostate that is bigger than normal, if you are female. ? A blockage in the part of your body that drains pee from the bladder (urethra). ? A kidney stone. ? A nerve condition that affects your bladder (neurogenic bladder). ? Not getting enough to drink. ? Not peeing often enough.  You have other conditions, such as: ? Diabetes. ? A weak disease-fighting system (immune system). ? Sickle cell disease. ? Gout. ? Injury of the spine. What are the signs or symptoms? Symptoms of this condition include:  Needing to pee right away (urgently).  Peeing often.  Peeing small amounts often.  Pain or burning when peeing.  Blood in the pee.  Pee that smells bad or not like normal.  Trouble peeing.  Pee that is cloudy.  Fluid coming from the vagina, if you are female.  Pain in the belly or lower back. Other symptoms include:  Throwing up (vomiting).  No urge to eat.  Feeling mixed up (confused).  Being tired  and grouchy (irritable).  A fever.  Watery poop (diarrhea). How is this treated? This condition may be treated with:  Antibiotic medicine.  Other medicines.  Drinking enough water. Follow these instructions at home:  Medicines  Take over-the-counter and prescription medicines only as told by your doctor.  If you were prescribed an antibiotic medicine, take it as told by your doctor. Do not stop taking it even if you start to feel better. General instructions  Make sure you: ? Pee until your bladder is empty. ? Do not hold pee for a long time. ? Empty your bladder after sex. ? Wipe from front to back after pooping if you are a female. Use each tissue one time when you wipe.  Drink enough fluid to keep your pee pale yellow.  Keep all follow-up visits as told by your doctor. This is important. Contact a doctor if:  You do not get better after 1-2 days.  Your symptoms go away and then come back. Get help right away if:  You have very bad back pain.  You have very bad pain in your lower belly.  You have a fever.  You are sick to your stomach (nauseous).  You are throwing up. Summary  A urinary tract infection (UTI) is an infection of any part of the urinary tract.  This condition is caused by germs in your genital area.  There are many risk factors for a UTI. These include having a small, thin   tube to drain pee and not being able to control when you pee or poop.  Treatment includes antibiotic medicines for germs.  Drink enough fluid to keep your pee pale yellow. This information is not intended to replace advice given to you by your health care provider. Make sure you discuss any questions you have with your health care provider. Document Released: 07/13/2007 Document Revised: 01/11/2018 Document Reviewed: 08/03/2017 Elsevier Patient Education  2020 [employer]  

## 2018-10-26 LAB — MICROSCOPIC EXAMINATION: RBC: NONE SEEN /hpf (ref 0–2)

## 2018-10-26 LAB — UA/M W/RFLX CULTURE, ROUTINE
Bilirubin, UA: NEGATIVE
Glucose, UA: NEGATIVE
Ketones, UA: NEGATIVE
Nitrite, UA: NEGATIVE
Protein,UA: NEGATIVE
Specific Gravity, UA: 1.01 (ref 1.005–1.030)
Urobilinogen, Ur: 0.2 mg/dL (ref 0.2–1.0)
pH, UA: 6.5 (ref 5.0–7.5)

## 2018-10-26 LAB — URINE CULTURE, REFLEX

## 2018-11-11 ENCOUNTER — Other Ambulatory Visit: Payer: Self-pay | Admitting: Family Medicine

## 2018-12-14 ENCOUNTER — Other Ambulatory Visit: Payer: Self-pay | Admitting: Nurse Practitioner

## 2018-12-14 NOTE — Telephone Encounter (Signed)
Spoke with patient and she will contact office to schedule appointment. Patient states that she is not requesting medication it's the pharmacy

## 2019-01-08 ENCOUNTER — Ambulatory Visit (INDEPENDENT_AMBULATORY_CARE_PROVIDER_SITE_OTHER): Payer: BC Managed Care – PPO | Admitting: Nurse Practitioner

## 2019-01-08 ENCOUNTER — Other Ambulatory Visit: Payer: Self-pay

## 2019-01-08 ENCOUNTER — Encounter: Payer: Self-pay | Admitting: Nurse Practitioner

## 2019-01-08 DIAGNOSIS — I10 Essential (primary) hypertension: Secondary | ICD-10-CM

## 2019-01-08 MED ORDER — LISINOPRIL-HYDROCHLOROTHIAZIDE 20-12.5 MG PO TABS: 1.0000 | ORAL_TABLET | Freq: Every day | ORAL | 3 refills | Status: DC

## 2019-01-08 NOTE — Progress Notes (Signed)
BP 117/75   Pulse 88   LMP 10/07/2016 (Approximate)    Subjective:    Patient ID: Alicia Copeland, female    DOB: 08-Nov-1960, 58 y.o.   MRN: [id number]  HPI: Alicia Copeland is a 58 y.o. female  Chief Complaint  Patient presents with  . Hypertension    . This visit was completed via FaceTime due to the restrictions of the COVID-19 pandemic. All issues as above were discussed and addressed. Physical exam was done as above through visual confirmation on Facetime. If it was felt that the patient should be evaluated in the office, they were directed there. The patient verbally consented to this visit. . Location of the patient: work . Location of the provider: work . Those involved with this call:  . Provider: Marnee Guarneri, DNP . CMA: Yvonna Alanis, CMA . Front Desk/Registration: Jill Side  . Time spent on call: 15 minutes with patient face to face via video conference. More than 50% of this time was spent in counseling and coordination of care. 10 minutes total spent in review of patient's record and preparation of their chart.  . I verified patient identity using two factors (patient name and date of birth). Patient consents verbally to being seen via telemedicine visit today.    HYPERTENSION Continues on Lisinopril-HCTZ without issue or ADR. Hypertension status: stable  Satisfied with current treatment? yes Duration of hypertension: chronic BP monitoring frequency:  rarely BP range: 117/75 this morning, often this is range at home BP medication side effects:  no Medication compliance: good compliance Aspirin: no Recurrent headaches: no Visual changes: no Palpitations: no Dyspnea: no Chest pain: no Lower extremity edema: no Dizzy/lightheaded: no  Relevant past medical, surgical, family and social history reviewed and updated as indicated. Interim medical history since our last visit reviewed. Allergies and medications reviewed and updated.  Review of  Systems  Constitutional: Negative for activity change, appetite change, diaphoresis, fatigue and fever.  Respiratory: Negative for cough, chest tightness and shortness of breath.   Cardiovascular: Negative for chest pain, palpitations and leg swelling.  Gastrointestinal: Negative for abdominal distention, abdominal pain, constipation, diarrhea, nausea and vomiting.  Endocrine: Negative for cold intolerance, heat intolerance, polydipsia, polyphagia and polyuria.  Neurological: Negative for dizziness, syncope, weakness, light-headedness, numbness and headaches.  Psychiatric/Behavioral: Negative.     Per HPI unless specifically indicated above     Objective:    BP 117/75   Pulse 88   LMP 10/07/2016 (Approximate)   Wt Readings from Last 3 Encounters:  02/19/18 148 lb (67.1 kg)  06/30/17 148 lb (67.1 kg)  05/30/17 146 lb (66.2 kg)    Physical Exam Vitals signs and nursing note reviewed.  Constitutional:      General: She is awake. She is not in acute distress.    Appearance: She is well-developed. She is not ill-appearing.  HENT:     Head: Normocephalic.     Right Ear: Hearing normal.     Left Ear: Hearing normal.  Eyes:     General: Lids are normal.        Right eye: No discharge.        Left eye: No discharge.     Conjunctiva/sclera: Conjunctivae normal.  Neck:     Musculoskeletal: Normal range of motion.  Pulmonary:     Effort: Pulmonary effort is normal. No accessory muscle usage or respiratory distress.  Neurological:     Mental Status: She is alert and oriented to person, place, and  time.  Psychiatric:        Attention and Perception: Attention normal.        Mood and Affect: Mood normal.        Behavior: Behavior normal. Behavior is cooperative.        Thought Content: Thought content normal.        Judgment: Judgment normal.     Results for orders placed or performed in visit on 10/24/18  Microscopic Examination   URINE  Result Value Ref Range   WBC, UA 6-10  (A) 0 - 5 /hpf   RBC None seen 0 - 2 /hpf   Epithelial Cells (non renal) 0-10 0 - 10 /hpf   Bacteria, UA Few (A) None seen/Few  Urine Culture, Reflex   URINE  Result Value Ref Range   Urine Culture, Routine Final report    Organism ID, Bacteria Comment   UA/M w/rflx Culture, Routine   Specimen: Urine   URINE  Result Value Ref Range   Specific Gravity, UA 1.010 1.005 - 1.030   pH, UA 6.5 5.0 - 7.5   Color, UA Yellow Yellow   Appearance Ur Clear Clear   Leukocytes,UA 1+ (A) Negative   Protein,UA Negative Negative/Trace   Glucose, UA Negative Negative   Ketones, UA Negative Negative   RBC, UA 1+ (A) Negative   Bilirubin, UA Negative Negative   Urobilinogen, Ur 0.2 0.2 - 1.0 mg/dL   Nitrite, UA Negative Negative   Microscopic Examination See below:    Urinalysis Reflex Comment       Assessment & Plan:   Problem List Items Addressed This Visit      Cardiovascular and Mediastinum   Hypertension    Chronic, stable with BP at goal on home readings.  Continue current medication regimen and adjust as needed.  Refills sent.  Plan on outpatient BMP.  Return in 6 months for annual physical (no pap).      Relevant Medications   lisinopril-hydrochlorothiazide (ZESTORETIC) 20-12.5 MG tablet      I discussed the assessment and treatment plan with the patient. The patient was provided an opportunity to ask questions and all were answered. The patient agreed with the plan and demonstrated an understanding of the instructions.   The patient was advised to call back or seek an in-person evaluation if the symptoms worsen or if the condition fails to improve as anticipated.   I provided 15 minutes of time during this encounter.  Follow up plan: Return in about 6 months (around 07/09/2019) for Annual physical (no pap).

## 2019-01-08 NOTE — Patient Instructions (Signed)

## 2019-01-08 NOTE — Assessment & Plan Note (Signed)
Chronic, stable with BP at goal on home readings.  Continue current medication regimen and adjust as needed.  Refills sent.  Plan on outpatient BMP.  Return in 6 months for annual physical (no pap).

## 2019-01-16 ENCOUNTER — Other Ambulatory Visit: Payer: Self-pay

## 2019-01-16 ENCOUNTER — Other Ambulatory Visit: Payer: BC Managed Care – PPO

## 2019-01-16 DIAGNOSIS — I10 Essential (primary) hypertension: Secondary | ICD-10-CM

## 2019-01-17 LAB — BASIC METABOLIC PANEL
BUN/Creatinine Ratio: 22 (ref 9–23)
BUN: 18 mg/dL (ref 6–24)
CO2: 27 mmol/L (ref 20–29)
Calcium: 9.5 mg/dL (ref 8.7–10.2)
Chloride: 100 mmol/L (ref 96–106)
Creatinine, Ser: 0.82 mg/dL (ref 0.57–1.00)
GFR calc Af Amer: 92 mL/min/{1.73_m2} (ref >59)
GFR calc non Af Amer: 80 mL/min/{1.73_m2} (ref >59)
Glucose: 140 mg/dL — ABNORMAL HIGH (ref 65–99)
Potassium: 3.2 mmol/L — ABNORMAL LOW (ref 3.5–5.2)
Sodium: 144 mmol/L (ref 134–144)

## 2019-02-28 ENCOUNTER — Telehealth: Payer: Self-pay

## 2019-02-28 NOTE — Telephone Encounter (Signed)
-----   Message from Venita Lick, NP sent at 02/28/2019  9:53 AM EST ----- I sent her a MyChart message back in December and set reminder to check.  If she received it she did not respond.  Can you please check on this since I do want to see her sooner than June to recheck these labs.  This was my message to her:  Good morning Alicia Copeland.  Your labs have returned.  Your glucose is slightly elevated, had you ate before having labs done?  If not we may want to check and A1C to screen for diabetes.  Your potassium was on low side.  Since your BP is so well controlled, you may benefit from stopping the Hydrochlorothiazide (HCTZ) portion of your BP medication and switching to straight Lisinopril only, as HCTZ can cause drop in potassium.  Do you have any swelling in the ankles?  If not I say we stop the HCTZ and I will send in script for Lisinopril only, then we can recheck potassium on outpatient labs in 2 weeks.  Also ensure you are taking a daily multivitamin and eating potassium rich foods, like bananas.  Let me know you received this message and if any questions.

## 2019-02-28 NOTE — Telephone Encounter (Signed)
Tried calling patient. No answer. Unable to leave a message. Phone rings couple times and cut off by itself. Will try to call later

## 2019-06-06 ENCOUNTER — Other Ambulatory Visit: Payer: Self-pay | Admitting: General Surgery

## 2019-06-10 ENCOUNTER — Other Ambulatory Visit: Payer: Self-pay | Admitting: General Surgery

## 2019-06-10 DIAGNOSIS — Z1231 Encounter for screening mammogram for malignant neoplasm of breast: Secondary | ICD-10-CM

## 2019-06-18 ENCOUNTER — Ambulatory Visit
Admission: RE | Admit: 2019-06-18 | Discharge: 2019-06-18 | Disposition: A | Discharge status: Home / Self Care | Payer: BC Managed Care – PPO | Source: Ambulatory Visit | Attending: General Surgery | Admitting: General Surgery

## 2019-06-18 DIAGNOSIS — Z1231 Encounter for screening mammogram for malignant neoplasm of breast: Secondary | ICD-10-CM | POA: Diagnosis present

## 2019-07-08 IMAGING — CR DG KNEE COMPLETE 4+V*R*
1 series · 4 of 4 positions shown · non-contrast
Comparison: None.

CLINICAL DATA: Right knee pain x1 year

EXAM:
RIGHT KNEE - COMPLETE 4+ VIEW

[Series 1: dg knee complete 4 views right · 0.14mm/px · 4 of 4 slices shown]
[im 1/4]
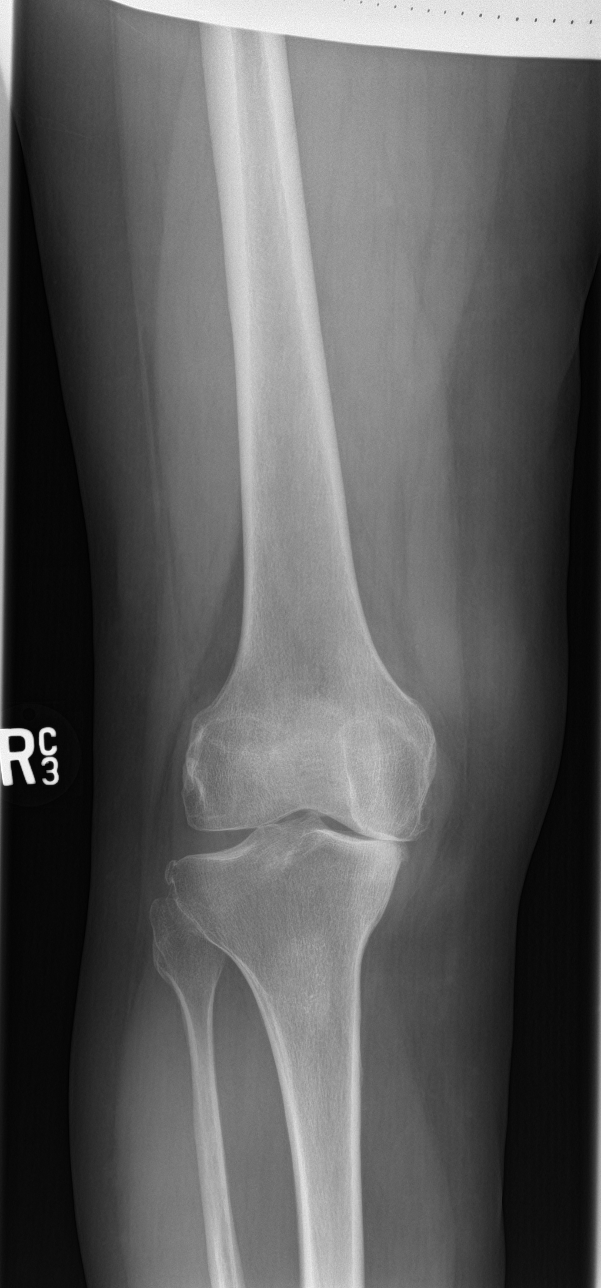
[im 2/4]
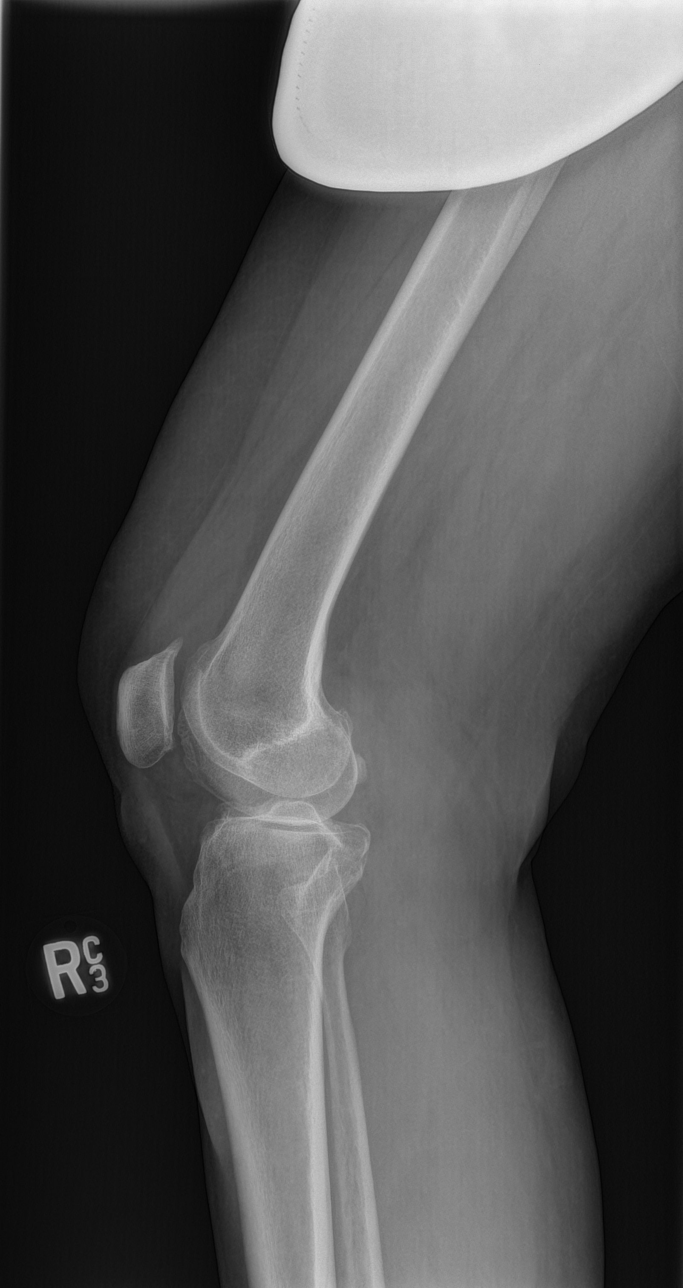
[im 3/4]
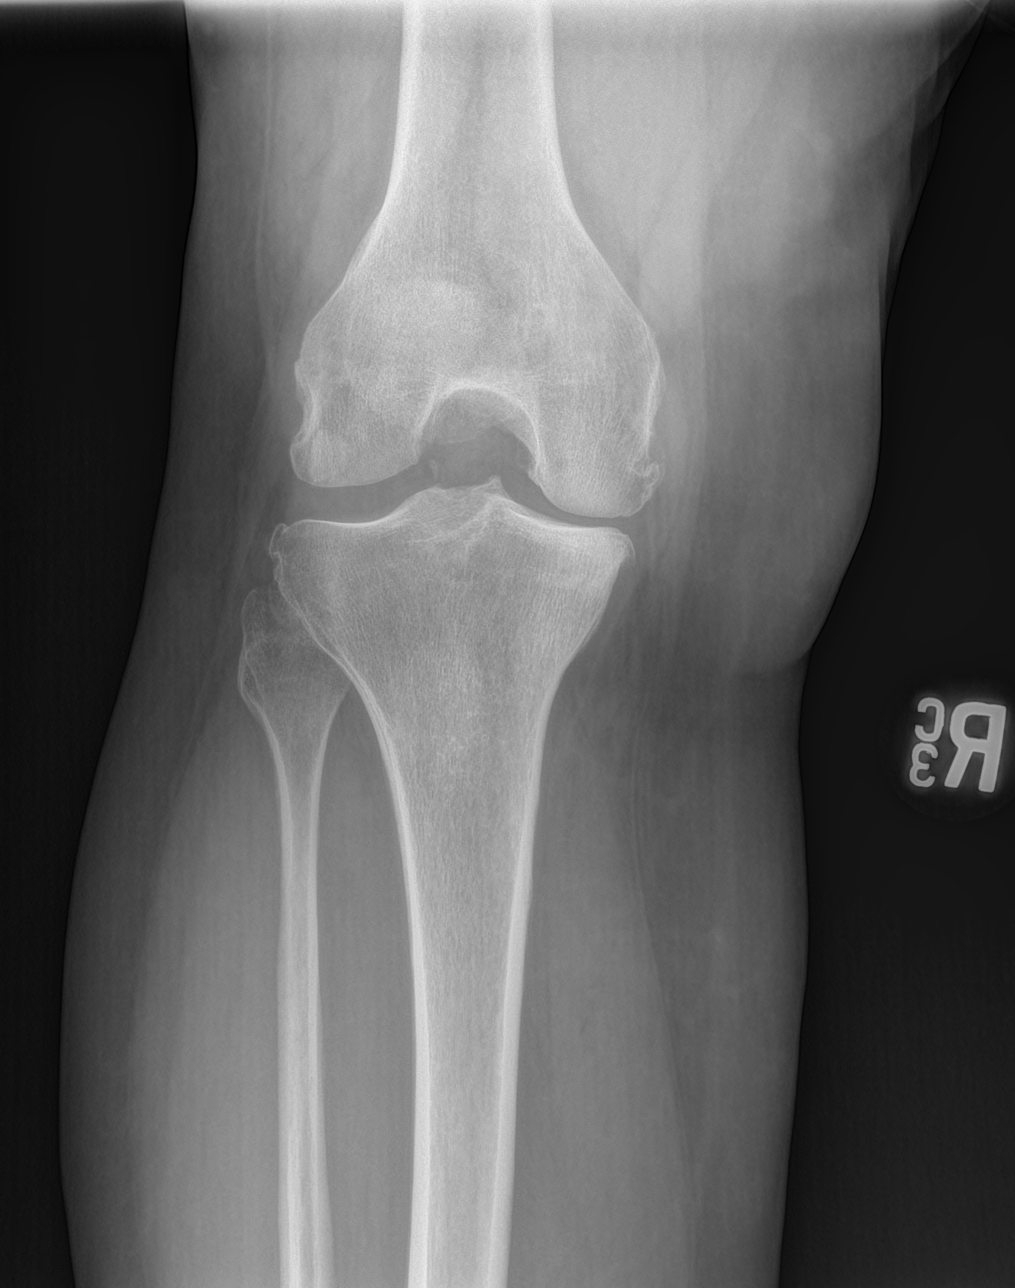
[im 4/4]
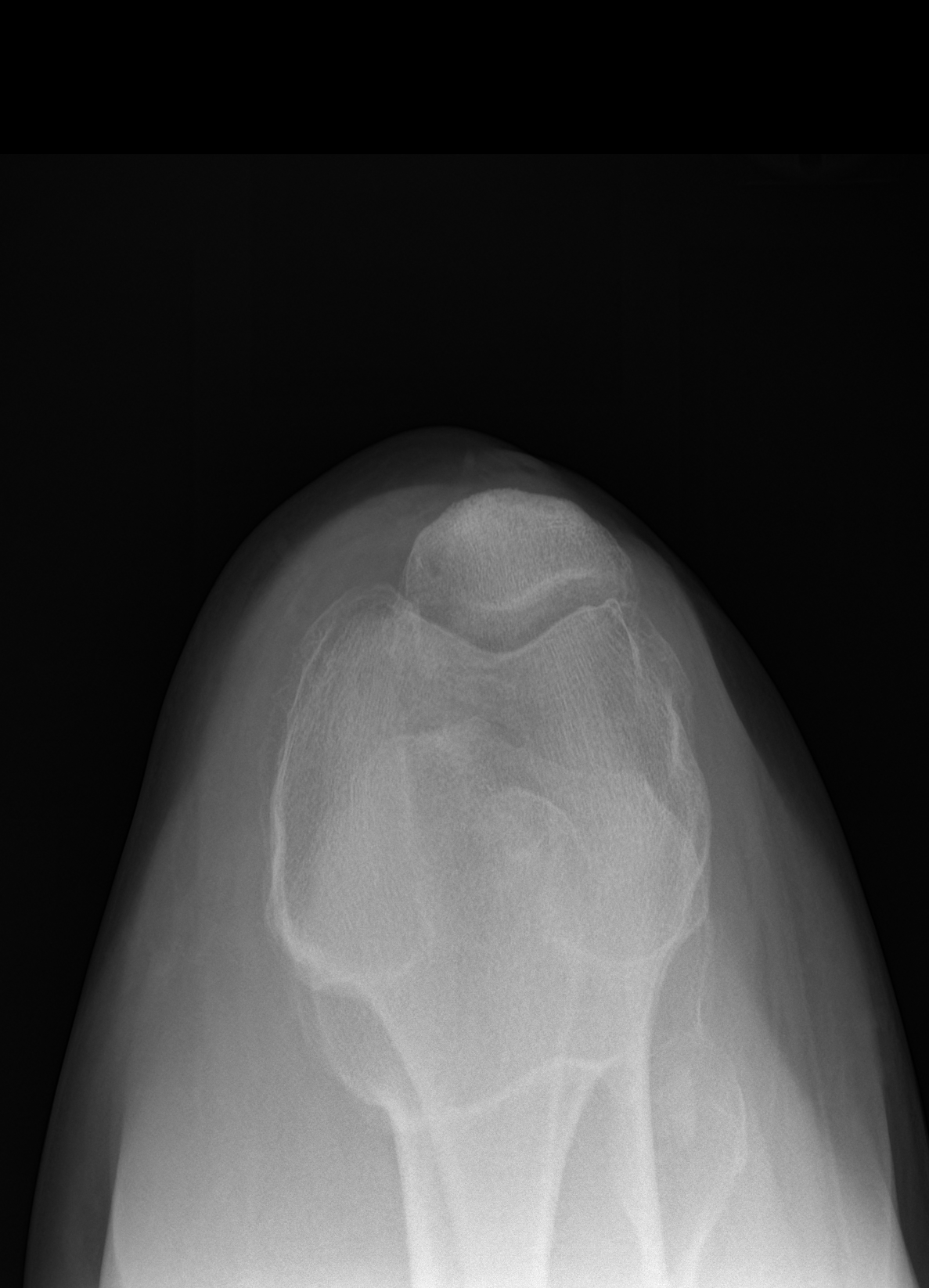

[4 of 4 positions shown; findings below may reference images not displayed]

FINDINGS: Degenerative changes, most prominent in the medial and
patellofemoral compartments.

Small suprapatellar knee joint effusion.

No fracture or dislocation is seen.
IMPRESSION: Degenerative changes with small suprapatellar knee joint effusion.

## 2019-07-29 ENCOUNTER — Other Ambulatory Visit: Payer: Self-pay

## 2019-07-29 ENCOUNTER — Ambulatory Visit (INDEPENDENT_AMBULATORY_CARE_PROVIDER_SITE_OTHER): Payer: BC Managed Care – PPO | Admitting: Nurse Practitioner

## 2019-07-29 ENCOUNTER — Encounter: Payer: Self-pay | Admitting: Nurse Practitioner

## 2019-07-29 VITALS — BP 115/72 | HR 80 | Temp 98.5°F | Ht 63.0 in | Wt 149.4 lb

## 2019-07-29 DIAGNOSIS — I10 Essential (primary) hypertension: Secondary | ICD-10-CM | POA: Diagnosis not present

## 2019-07-29 DIAGNOSIS — Z0000 Encounter for general adult medical examination without abnormal findings: Secondary | ICD-10-CM | POA: Diagnosis not present

## 2019-07-29 DIAGNOSIS — Z86018 Personal history of other benign neoplasm: Secondary | ICD-10-CM | POA: Diagnosis not present

## 2019-07-29 DIAGNOSIS — Z13220 Encounter for screening for lipoid disorders: Secondary | ICD-10-CM | POA: Diagnosis not present

## 2019-07-29 DIAGNOSIS — Z1329 Encounter for screening for other suspected endocrine disorder: Secondary | ICD-10-CM

## 2019-07-29 DIAGNOSIS — Z1211 Encounter for screening for malignant neoplasm of colon: Secondary | ICD-10-CM

## 2019-07-29 DIAGNOSIS — Z6826 Body mass index (BMI) 26.0-26.9, adult: Secondary | ICD-10-CM | POA: Diagnosis not present

## 2019-07-29 MED ORDER — LISINOPRIL-HYDROCHLOROTHIAZIDE 20-12.5 MG PO TABS: 1.0000 | ORAL_TABLET | Freq: Every day | ORAL | 4 refills | Status: DC

## 2019-07-29 NOTE — Patient Instructions (Signed)
DASH Eating Plan DASH stands for "Dietary Approaches to Stop Hypertension." The DASH eating plan is a healthy eating plan that has been shown to reduce high blood pressure (hypertension). It may also reduce your risk for type 2 diabetes, heart disease, and stroke. The DASH eating plan may also help with weight loss. What are tips for following this plan?  General guidelines  Avoid eating more than 2,300 mg (milligrams) of salt (sodium) a day. If you have hypertension, you may need to reduce your sodium intake to 1,500 mg a day.  Limit alcohol intake to no more than 1 drink a day for nonpregnant women and 2 drinks a day for men. One drink equals 12 oz of beer, 5 oz of wine, or 1 oz of hard liquor.  Work with your health care provider to maintain a healthy body weight or to lose weight. Ask what an ideal weight is for you.  Get at least 30 minutes of exercise that causes your heart to beat faster (aerobic exercise) most days of the week. Activities may include walking, swimming, or biking.  Work with your health care provider or diet and nutrition specialist (dietitian) to adjust your eating plan to your individual calorie needs. Reading food labels   Check food labels for the amount of sodium per serving. Choose foods with less than 5 percent of the Daily Value of sodium. Generally, foods with less than 300 mg of sodium per serving fit into this eating plan.  To find whole grains, look for the word "whole" as the first word in the ingredient list. Shopping  Buy products labeled as "low-sodium" or "no salt added."  Buy fresh foods. Avoid canned foods and premade or frozen meals. Cooking  Avoid adding salt when cooking. Use salt-free seasonings or herbs instead of table salt or sea salt. Check with your health care provider or pharmacist before using salt substitutes.  Do not fry foods. Cook foods using healthy methods such as baking, boiling, grilling, and broiling instead.  Cook with  heart-healthy oils, such as olive, canola, soybean, or sunflower oil. Meal planning  Eat a balanced diet that includes: ? 5 or more servings of fruits and vegetables each day. At each meal, try to fill half of your plate with fruits and vegetables. ? Up to 6-8 servings of whole grains each day. ? Less than 6 oz of lean meat, poultry, or fish each day. A 3-oz serving of meat is about the same size as a deck of cards. One egg equals 1 oz. ? 2 servings of low-fat dairy each day. ? A serving of nuts, seeds, or beans 5 times each week. ? Heart-healthy fats. Healthy fats called Omega-3 fatty acids are found in foods such as flaxseeds and coldwater fish, like sardines, salmon, and mackerel.  Limit how much you eat of the following: ? Canned or prepackaged foods. ? Food that is high in trans fat, such as fried foods. ? Food that is high in saturated fat, such as fatty meat. ? Sweets, desserts, sugary drinks, and other foods with added sugar. ? Full-fat dairy products.  Do not salt foods before eating.  Try to eat at least 2 vegetarian meals each week.  Eat more home-cooked food and less restaurant, buffet, and fast food.  When eating at a restaurant, ask that your food be prepared with less salt or no salt, if possible. What foods are recommended? The items listed may not be a complete list. Talk with your dietitian about   what dietary choices are best for you. Grains Whole-grain or whole-wheat bread. Whole-grain or whole-wheat pasta. Brown rice. Oatmeal. Quinoa. Bulgur. Whole-grain and low-sodium cereals. Pita bread. Low-fat, low-sodium crackers. Whole-wheat flour tortillas. Vegetables Fresh or frozen vegetables (raw, steamed, roasted, or grilled). Low-sodium or reduced-sodium tomato and vegetable juice. Low-sodium or reduced-sodium tomato sauce and tomato paste. Low-sodium or reduced-sodium canned vegetables. Fruits All fresh, dried, or frozen fruit. Canned fruit in natural juice (without  added sugar). Meat and other protein foods Skinless chicken or turkey. Ground chicken or turkey. Pork with fat trimmed off. Fish and seafood. Egg whites. Dried beans, peas, or lentils. Unsalted nuts, nut butters, and seeds. Unsalted canned beans. Lean cuts of beef with fat trimmed off. Low-sodium, lean deli meat. Dairy Low-fat (1%) or fat-free (skim) milk. Fat-free, low-fat, or reduced-fat cheeses. Nonfat, low-sodium ricotta or cottage cheese. Low-fat or nonfat yogurt. Low-fat, low-sodium cheese. Fats and oils Soft margarine without trans fats. Vegetable oil. Low-fat, reduced-fat, or light mayonnaise and salad dressings (reduced-sodium). Canola, safflower, olive, soybean, and sunflower oils. Avocado. Seasoning and other foods Herbs. Spices. Seasoning mixes without salt. Unsalted popcorn and pretzels. Fat-free sweets. What foods are not recommended? The items listed may not be a complete list. Talk with your dietitian about what dietary choices are best for you. Grains Baked goods made with fat, such as croissants, muffins, or some breads. Dry pasta or rice meal packs. Vegetables Creamed or fried vegetables. Vegetables in a cheese sauce. Regular canned vegetables (not low-sodium or reduced-sodium). Regular canned tomato sauce and paste (not low-sodium or reduced-sodium). Regular tomato and vegetable juice (not low-sodium or reduced-sodium). Pickles. Olives. Fruits Canned fruit in a light or heavy syrup. Fried fruit. Fruit in cream or butter sauce. Meat and other protein foods Fatty cuts of meat. Ribs. Fried meat. Bacon. Sausage. Bologna and other processed lunch meats. Salami. Fatback. Hotdogs. Bratwurst. Salted nuts and seeds. Canned beans with added salt. Canned or smoked fish. Whole eggs or egg yolks. Chicken or turkey with skin. Dairy Whole or 2% milk, cream, and half-and-half. Whole or full-fat cream cheese. Whole-fat or sweetened yogurt. Full-fat cheese. Nondairy creamers. Whipped toppings.  Processed cheese and cheese spreads. Fats and oils Butter. Stick margarine. Lard. Shortening. Ghee. Bacon fat. Tropical oils, such as coconut, palm kernel, or palm oil. Seasoning and other foods Salted popcorn and pretzels. Onion salt, garlic salt, seasoned salt, table salt, and sea salt. Worcestershire sauce. Tartar sauce. Barbecue sauce. Teriyaki sauce. Soy sauce, including reduced-sodium. Steak sauce. Canned and packaged gravies. Fish sauce. Oyster sauce. Cocktail sauce. Horseradish that you find on the shelf. Ketchup. Mustard. Meat flavorings and tenderizers. Bouillon cubes. Hot sauce and Tabasco sauce. Premade or packaged marinades. Premade or packaged taco seasonings. Relishes. Regular salad dressings. Where to find more information:  National Heart, Lung, and Blood Institute: www.nhlbi.nih.gov  American Heart Association: www.heart.org Summary  The DASH eating plan is a healthy eating plan that has been shown to reduce high blood pressure (hypertension). It may also reduce your risk for type 2 diabetes, heart disease, and stroke.  With the DASH eating plan, you should limit salt (sodium) intake to 2,300 mg a day. If you have hypertension, you may need to reduce your sodium intake to 1,500 mg a day.  When on the DASH eating plan, aim to eat more fresh fruits and vegetables, whole grains, lean proteins, low-fat dairy, and heart-healthy fats.  Work with your health care provider or diet and nutrition specialist (dietitian) to adjust your eating plan to your   individual calorie needs. This information is not intended to replace advice given to you by your health care provider. Make sure you discuss any questions you have with your health care provider. Document Revised: 01/06/2017 Document Reviewed: 01/18/2016 Elsevier Patient Education  2020 Elsevier Inc.  

## 2019-07-29 NOTE — Progress Notes (Signed)
BP 115/72   Pulse 80   Temp 98.5 F (36.9 C) (Oral)   Ht 5\' 3"  (1.6 m)   Wt 149 lb 6.4 oz (67.8 kg)   LMP 10/07/2016 (Approximate)   SpO2 96%   BMI 26.47 kg/m    Subjective:    Patient ID: Alicia Copeland, female    DOB: 07/20/1960, 59 y.o.   MRN: [id number]  HPI: Alicia Copeland is a 59 y.o. female presenting on 07/29/2019 for comprehensive medical examination. Current medical complaints include:none  She currently lives with: husband Menopausal Symptoms: no   HYPERTENSION Continues on Lisinopril-HCTZ without issue or ADR.  Last labs in December 2020 noted K+ 3.2, she missed repeat labs. Hypertension status: stable  Satisfied with current treatment? yes Duration of hypertension: chronic BP monitoring frequency: not checking BP range:  BP medication side effects:  no Medication compliance: good compliance Aspirin: no Recurrent headaches: no Visual changes: no Palpitations: no Dyspnea: no Chest pain: no Lower extremity edema: no Dizzy/lightheaded: no  The 10-year ASCVD risk score Mikey Bussing DC Jr., et al., 2013) is: 3.5%   Values used to calculate the score:     Age: 25 years     Sex: Female     Is Non-Hispanic African American: No     Diabetic: No     Tobacco smoker: No     Systolic Blood Pressure: 063 mmHg     Is BP treated: Yes     HDL Cholesterol: 45 mg/dL     Total Cholesterol: 208 mg/dL   Depression Screen done today and results listed below:  Depression screen [facility] 2/9 07/29/2019 01/08/2019 06/30/2017 12/30/2016 12/30/2015  Decreased Interest 0 0 0 0 0  Down, Depressed, Hopeless 0 0 0 0 0  PHQ - 2 Score 0 0 0 0 0  Altered sleeping - - - 0 0  Tired, decreased energy - - - 0 0  Change in appetite - - - 0 0  Feeling bad or failure about yourself  - - - 0 0  Trouble concentrating - - - 0 0  Moving slowly or fidgety/restless - - - 0 0  Suicidal thoughts - - - 0 0  PHQ-9 Score - - - 0 0    The patient does not have a history of falls. I did not complete a  risk assessment for falls. A plan of care for falls was not documented.   Past Medical History:  Past Medical History:  Diagnosis Date  . 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  . Hypertension 2009  . Lump or mass in breast 2011  . Special screening for malignant neoplasms, colon     Surgical History:  Past Surgical History:  Procedure Laterality Date  . BREAST BIOPSY Right 2007   Fibroadenoma, core biopsy, ER/PR negative.done in [place]  . BREAST SURGERY Right 03/11/2010   Reexcision right breast lesion at the 10:00 position with no evidence of residual flow in tumor  . BREAST SURGERY Right 02/04/2010   Right breast re-excision, no evidence of recurrent phyllodes tumor  . COLONOSCOPY  2012   Dr. Candace Cruise    Medications:  Current Outpatient Medications on File Prior to Visit  Medication Sig  . diclofenac sodium (VOLTAREN) 1 % GEL Apply 2 g topically 4 (four) times daily.  Marland Kitchen  lisinopril-hydrochlorothiazide (ZESTORETIC) 20-12.5 MG tablet Take 1 tablet by mouth daily.   No current facility-administered medications on file prior to visit.    Allergies:  Allergies  Allergen Reactions  . Sulfa Antibiotics Hives and Other (See Comments)    Alters liver function. "breaks out in liver"    Social History:  Social History   Socioeconomic History  . Marital status: Married    Spouse name: Not on file  . Number of children: Not on file  . Years of education: Not on file  . Highest education level: Not on file  Occupational History  . Not on file  Tobacco Use  . Smoking status: Never Smoker  . Smokeless tobacco: Never Used  Vaping Use  . Vaping Use: Never used  Substance and Sexual Activity  . Alcohol use: No  . Drug use: No  . Sexual activity: Yes    Birth  control/protection: Post-menopausal  Other Topics Concern  . Not on file  Social History Narrative  . Not on file   Social Determinants of Health   Financial Resource Strain:   . Difficulty of Paying Living Expenses:   Food Insecurity:   . Worried About Charity fundraiser in the Last Year:   . Arboriculturist in the Last Year:   Transportation Needs:   . Film/video editor (Medical):   Marland Kitchen Lack of Transportation (Non-Medical):   Physical Activity:   . Days of Exercise per Week:   . Minutes of Exercise per Session:   Stress:   . Feeling of Stress :   Social Connections:   . Frequency of Communication with Friends and Family:   . Frequency of Social Gatherings with Friends and Family:   . Attends Religious Services:   . Active Member of Clubs or Organizations:   . Attends Archivist Meetings:   Marland Kitchen Marital Status:   Intimate Partner Violence:   . Fear of Current or Ex-Partner:   . Emotionally Abused:   Marland Kitchen Physically Abused:   . Sexually Abused:    Social History   Tobacco Use  Smoking Status Never Smoker  Smokeless Tobacco Never Used   Social History   Substance and Sexual Activity  Alcohol Use No    Family History:  Family History  Problem Relation Age of Onset  . Diabetes Mother   . Breast cancer Mother   . Colon cancer Mother        breast cancer, lymphoma, greater than age 14  . Cancer Maternal Grandmother 74       colon cancer, greater than age 24  . Cancer Paternal Grandmother        breast cancer, greater than age 61  . Breast cancer Paternal Grandmother   . Hypertension Father   . Diabetes Father   . Heart attack Father   . Hypertension Brother   . Heart attack Maternal Grandfather     Past medical history, surgical history, medications, allergies, family history and social history reviewed with patient today and changes made to appropriate areas of the chart.   Review of Systems - negative All other ROS negative except what is  listed above and in the HPI.      Objective:    BP 115/72   Pulse 80   Temp 98.5 F (36.9 C) (Oral)   Ht 5\' 3"  (1.6 m)   Wt 149 lb 6.4 oz (67.8 kg)   LMP 10/07/2016 (Approximate)   SpO2 96%   BMI  26.47 kg/m   Wt Readings from Last 3 Encounters:  07/29/19 149 lb 6.4 oz (67.8 kg)  02/19/18 148 lb (67.1 kg)  06/30/17 148 lb (67.1 kg)    Physical Exam Constitutional:      General: She is awake. She is not in acute distress.    Appearance: She is well-developed. She is not ill-appearing.  HENT:     Head: Normocephalic and atraumatic.     Right Ear: Hearing, tympanic membrane, ear canal and external ear normal. No drainage.     Left Ear: Hearing, tympanic membrane, ear canal and external ear normal. No drainage.     Nose: Nose normal.     Right Sinus: No maxillary sinus tenderness or frontal sinus tenderness.     Left Sinus: No maxillary sinus tenderness or frontal sinus tenderness.     Mouth/Throat:     Mouth: Mucous membranes are moist.     Pharynx: Oropharynx is clear. Uvula midline. No pharyngeal swelling, oropharyngeal exudate or posterior oropharyngeal erythema.  Eyes:     General: Lids are normal.        Right eye: No discharge.        Left eye: No discharge.     Extraocular Movements: Extraocular movements intact.     Conjunctiva/sclera: Conjunctivae normal.     Pupils: Pupils are equal, round, and reactive to light.     Visual Fields: Right eye visual fields normal and left eye visual fields normal.  Neck:     Thyroid: No thyromegaly.     Vascular: No carotid bruit.     Trachea: Trachea normal.  Cardiovascular:     Rate and Rhythm: Normal rate and regular rhythm.     Heart sounds: Normal heart sounds. No murmur heard.  No gallop.   Pulmonary:     Effort: Pulmonary effort is normal. No accessory muscle usage or respiratory distress.     Breath sounds: Normal breath sounds.  Chest:     Comments: Deferred, recently performed with Dr. Bary Castilla Abdominal:      General: Bowel sounds are normal.     Palpations: Abdomen is soft. There is no hepatomegaly or splenomegaly.     Tenderness: There is no abdominal tenderness.  Musculoskeletal:        General: Normal range of motion.     Cervical back: Normal range of motion and neck supple.     Right lower leg: No edema.     Left lower leg: No edema.  Lymphadenopathy:     Head:     Right side of head: No submental, submandibular, tonsillar, preauricular or posterior auricular adenopathy.     Left side of head: No submental, submandibular, tonsillar, preauricular or posterior auricular adenopathy.     Cervical: No cervical adenopathy.  Skin:    General: Skin is warm and dry.     Capillary Refill: Capillary refill takes less than 2 seconds.     Findings: No rash.  Neurological:     Mental Status: She is alert and oriented to person, place, and time.     Cranial Nerves: Cranial nerves are intact.     Gait: Gait is intact.     Deep Tendon Reflexes: Reflexes are normal and symmetric.     Reflex Scores:      Brachioradialis reflexes are 2+ on the right side and 2+ on the left side.      Patellar reflexes are 2+ on the right side and 2+ on the left side. Psychiatric:  Attention and Perception: Attention normal.        Mood and Affect: Mood normal.        Speech: Speech normal.        Behavior: Behavior normal. Behavior is cooperative.        Thought Content: Thought content normal.        Judgment: Judgment normal.     Results for orders placed or performed in visit on 25/85/27  Basic Metabolic Panel (BMET)  Result Value Ref Range   Glucose 140 (H) 65 - 99 mg/dL   BUN 18 6 - 24 mg/dL   Creatinine, Ser 0.82 0.57 - 1.00 mg/dL   GFR calc non Af Amer 80 >59 mL/min/1.73   GFR calc Af Amer 92 >59 mL/min/1.73   BUN/Creatinine Ratio 22 9 - 23   Sodium 144 134 - 144 mmol/L   Potassium 3.2 (L) 3.5 - 5.2 mmol/L   Chloride 100 96 - 106 mmol/L   CO2 27 20 - 29 mmol/L   Calcium 9.5 8.7 - 10.2 mg/dL        Assessment & Plan:   Problem List Items Addressed This Visit      Cardiovascular and Mediastinum   Hypertension    Chronic, stable with BP at goal in office today.  Continue current medication regimen and adjust as needed.  Refills sent as needed.  Plan on CMP and TSH today + CBC.  Recommend she monitor BP at home at least 3 mornings a week and document + focus on DASH diet.  Return in 6 months for follow-up.        Relevant Orders   CBC with Differential/Platelet   Comprehensive metabolic panel   TSH     Other   History of benign phyllodes neoplasm of breast    Continue collaboration with Dr. Bary Castilla.      BMI 26.0-26.9,adult    Recommend continued focus on healthy diet and regular exercise at home.       Other Visit Diagnoses    Encounter for annual physical exam    -  Primary   Thyroid disorder screen       TSH on labs today   Relevant Orders   TSH   Screening cholesterol level       Lipid panel on labs today   Relevant Orders   Lipid Panel w/o Chol/HDL Ratio   Colon cancer screening       GI referral placed.   Relevant Orders   Ambulatory referral to Gastroenterology       Follow up plan: Return in about 6 months (around 01/28/2020) for HTN.   LABORATORY TESTING:  - Pap smear: up to date -- gets these at [facility], has history of colposcopy in 2019  IMMUNIZATIONS:   - Tdap: Tetanus vaccination status reviewed: Refused. - Influenza: Up to date - Pneumovax: Not applicable - Prevnar: Not applicable - HPV: Not applicable - Zostavax vaccine: Refused  SCREENING: -Mammogram: Up to date -- Dr. Bary Castilla - Colonoscopy: Ordered today  - Bone Density: Not applicable  -Hearing Test: Not applicable  -Spirometry: Not applicable   PATIENT COUNSELING:   Advised to take 1 mg of folate supplement per day if capable of pregnancy.   Sexuality: Discussed sexually transmitted diseases, partner selection, use of condoms, avoidance of unintended pregnancy  and  contraceptive alternatives.   Advised to avoid cigarette smoking.  I discussed with the patient that most people either abstain from alcohol or drink within safe limits (<=14/week and <=  4 drinks/occasion for males, <=7/weeks and <= 3 drinks/occasion for females) and that the risk for alcohol disorders and other health effects rises proportionally with the number of drinks per week and how often a drinker exceeds daily limits.  Discussed cessation/primary prevention of drug use and availability of treatment for abuse.   Diet: Encouraged to adjust caloric intake to maintain  or achieve ideal body weight, to reduce intake of dietary saturated fat and total fat, to limit sodium intake by avoiding high sodium foods and not adding table salt, and to maintain adequate dietary potassium and calcium preferably from fresh fruits, vegetables, and low-fat dairy products.    stressed the importance of regular exercise  Injury prevention: Discussed safety belts, safety helmets, smoke detector, smoking near bedding or upholstery.   Dental health: Discussed importance of regular tooth brushing, flossing, and dental visits.    NEXT PREVENTATIVE PHYSICAL DUE IN 1 YEAR. Return in about 6 months (around 01/28/2020) for HTN.

## 2019-07-29 NOTE — Assessment & Plan Note (Signed)
Chronic, stable with BP at goal in office today.  Continue current medication regimen and adjust as needed.  Refills sent as needed.  Plan on CMP and TSH today + CBC.  Recommend she monitor BP at home at least 3 mornings a week and document + focus on DASH diet.  Return in 6 months for follow-up.

## 2019-07-29 NOTE — Assessment & Plan Note (Signed)
Continue collaboration with Dr. Bary Castilla.

## 2019-07-29 NOTE — Assessment & Plan Note (Signed)
Recommend continued focus on healthy diet and regular exercise at home.

## 2019-07-30 LAB — COMPREHENSIVE METABOLIC PANEL
ALT: 21 IU/L (ref 0–32)
AST: 19 IU/L (ref 0–40)
Albumin/Globulin Ratio: 2 (ref 1.2–2.2)
Albumin: 4.8 g/dL (ref 3.8–4.9)
Alkaline Phosphatase: 99 IU/L (ref 48–121)
BUN/Creatinine Ratio: 16 (ref 9–23)
BUN: 14 mg/dL (ref 6–24)
Bilirubin Total: 0.4 mg/dL (ref 0.0–1.2)
CO2: 25 mmol/L (ref 20–29)
Calcium: 10.1 mg/dL (ref 8.7–10.2)
Chloride: 99 mmol/L (ref 96–106)
Creatinine, Ser: 0.9 mg/dL (ref 0.57–1.00)
GFR calc Af Amer: 82 mL/min/{1.73_m2} (ref >59)
GFR calc non Af Amer: 71 mL/min/{1.73_m2} (ref >59)
Globulin, Total: 2.4 g/dL (ref 1.5–4.5)
Glucose: 81 mg/dL (ref 65–99)
Potassium: 4.8 mmol/L (ref 3.5–5.2)
Sodium: 140 mmol/L (ref 134–144)
Total Protein: 7.2 g/dL (ref 6.0–8.5)

## 2019-07-30 LAB — CBC WITH DIFFERENTIAL/PLATELET
Basophils Absolute: 0 10*3/uL (ref 0.0–0.2)
Basos: 0 %
EOS (ABSOLUTE): 0.1 10*3/uL (ref 0.0–0.4)
Eos: 2 %
Hematocrit: 41 % (ref 34.0–46.6)
Hemoglobin: 13.5 g/dL (ref 11.1–15.9)
Immature Grans (Abs): 0 10*3/uL (ref 0.0–0.1)
Immature Granulocytes: 0 %
Lymphocytes Absolute: 2.7 10*3/uL (ref 0.7–3.1)
Lymphs: 33 %
MCH: 29.3 pg (ref 26.6–33.0)
MCHC: 32.9 g/dL (ref 31.5–35.7)
MCV: 89 fL (ref 79–97)
Monocytes Absolute: 0.5 10*3/uL (ref 0.1–0.9)
Monocytes: 5 %
Neutrophils Absolute: 5 10*3/uL (ref 1.4–7.0)
Neutrophils: 60 %
Platelets: 225 10*3/uL (ref 150–450)
RBC: 4.6 x10E6/uL (ref 3.77–5.28)
RDW: 13.9 % (ref 11.7–15.4)
WBC: 8.4 10*3/uL (ref 3.4–10.8)

## 2019-07-30 LAB — LIPID PANEL W/O CHOL/HDL RATIO
Cholesterol, Total: 247 mg/dL — ABNORMAL HIGH (ref 100–199)
HDL: 41 mg/dL (ref >39)
LDL Chol Calc (NIH): 163 mg/dL — ABNORMAL HIGH (ref 0–99)
Triglycerides: 230 mg/dL — ABNORMAL HIGH (ref 0–149)
VLDL Cholesterol Cal: 43 mg/dL — ABNORMAL HIGH (ref 5–40)

## 2019-07-30 LAB — TSH: TSH: 2.39 u[IU]/mL (ref 0.450–4.500)

## 2019-07-30 NOTE — Progress Notes (Signed)
Contacted via [facility] morning Marcee, your labs have returned and all is normal with exception of cholesterol levels.  Your cholesterol is still high, but continued recommendations to make lifestyle changes. Your LDL is above normal. The LDL is the bad cholesterol. Over time and in combination with inflammation and other factors, this contributes to plaque which in turn may lead to stroke and/or heart attack down the road. Sometimes high LDL is primarily genetic, and people might be eating all the right foods but still have high numbers. Other times, there is room for improvement in one's diet and eating healthier can bring this number down and potentially reduce one's risk of heart attack and/or stroke.   To reduce your LDL, Remember - more fruits and vegetables, more fish, and limit red meat and dairy products. More soy, nuts, beans, barley, lentils, oats and plant sterol ester enriched margarine instead of butter. I also encourage eliminating sugar and processed food. Remember, shop on the outside of the grocery store and visit your [employer]. If you would like to talk with me about dietary changes plus or minus medications for your cholesterol, please let me know. We should recheck your cholesterol in 6 months.  Any questions, please let me know.  Have a good day.

## 2019-08-06 ENCOUNTER — Encounter: Payer: Self-pay | Admitting: *Deleted

## 2020-02-04 ENCOUNTER — Other Ambulatory Visit: Payer: Self-pay

## 2020-02-04 ENCOUNTER — Ambulatory Visit: Payer: BC Managed Care – PPO | Admitting: Nurse Practitioner

## 2020-02-04 ENCOUNTER — Encounter: Payer: Self-pay | Admitting: Nurse Practitioner

## 2020-02-04 DIAGNOSIS — Z6825 Body mass index (BMI) 25.0-25.9, adult: Secondary | ICD-10-CM | POA: Diagnosis not present

## 2020-02-04 DIAGNOSIS — I10 Essential (primary) hypertension: Secondary | ICD-10-CM

## 2020-02-04 MED ORDER — LISINOPRIL 20 MG PO TABS: 20.0000 mg | ORAL_TABLET | Freq: Every day | ORAL | 3 refills | Status: DC

## 2020-02-04 NOTE — Assessment & Plan Note (Signed)
Continue focus on healthy diet and regular activity.

## 2020-02-04 NOTE — Patient Instructions (Signed)
DASH Eating Plan DASH stands for "Dietary Approaches to Stop Hypertension." The DASH eating plan is a healthy eating plan that has been shown to reduce high blood pressure (hypertension). It may also reduce your risk for type 2 diabetes, heart disease, and stroke. The DASH eating plan may also help with weight loss. What are tips for following this plan?  General guidelines  Avoid eating more than 2,300 mg (milligrams) of salt (sodium) a day. If you have hypertension, you may need to reduce your sodium intake to 1,500 mg a day.  Limit alcohol intake to no more than 1 drink a day for nonpregnant women and 2 drinks a day for men. One drink equals 12 oz of beer, 5 oz of wine, or 1 oz of hard liquor.  Work with your health care provider to maintain a healthy body weight or to lose weight. Ask what an ideal weight is for you.  Get at least 30 minutes of exercise that causes your heart to beat faster (aerobic exercise) most days of the week. Activities may include walking, swimming, or biking.  Work with your health care provider or diet and nutrition specialist (dietitian) to adjust your eating plan to your individual calorie needs. Reading food labels   Check food labels for the amount of sodium per serving. Choose foods with less than 5 percent of the Daily Value of sodium. Generally, foods with less than 300 mg of sodium per serving fit into this eating plan.  To find whole grains, look for the word "whole" as the first word in the ingredient list. Shopping  Buy products labeled as "low-sodium" or "no salt added."  Buy fresh foods. Avoid canned foods and premade or frozen meals. Cooking  Avoid adding salt when cooking. Use salt-free seasonings or herbs instead of table salt or sea salt. Check with your health care provider or pharmacist before using salt substitutes.  Do not fry foods. Cook foods using healthy methods such as baking, boiling, grilling, and broiling instead.  Cook with  heart-healthy oils, such as olive, canola, soybean, or sunflower oil. Meal planning  Eat a balanced diet that includes: ? 5 or more servings of fruits and vegetables each day. At each meal, try to fill half of your plate with fruits and vegetables. ? Up to 6-8 servings of whole grains each day. ? Less than 6 oz of lean meat, poultry, or fish each day. A 3-oz serving of meat is about the same size as a deck of cards. One egg equals 1 oz. ? 2 servings of low-fat dairy each day. ? A serving of nuts, seeds, or beans 5 times each week. ? Heart-healthy fats. Healthy fats called Omega-3 fatty acids are found in foods such as flaxseeds and coldwater fish, like sardines, salmon, and mackerel.  Limit how much you eat of the following: ? Canned or prepackaged foods. ? Food that is high in trans fat, such as fried foods. ? Food that is high in saturated fat, such as fatty meat. ? Sweets, desserts, sugary drinks, and other foods with added sugar. ? Full-fat dairy products.  Do not salt foods before eating.  Try to eat at least 2 vegetarian meals each week.  Eat more home-cooked food and less restaurant, buffet, and fast food.  When eating at a restaurant, ask that your food be prepared with less salt or no salt, if possible. What foods are recommended? The items listed may not be a complete list. Talk with your dietitian about   what dietary choices are best for you. Grains Whole-grain or whole-wheat bread. Whole-grain or whole-wheat pasta. Brown rice. Oatmeal. Quinoa. Bulgur. Whole-grain and low-sodium cereals. Pita bread. Low-fat, low-sodium crackers. Whole-wheat flour tortillas. Vegetables Fresh or frozen vegetables (raw, steamed, roasted, or grilled). Low-sodium or reduced-sodium tomato and vegetable juice. Low-sodium or reduced-sodium tomato sauce and tomato paste. Low-sodium or reduced-sodium canned vegetables. Fruits All fresh, dried, or frozen fruit. Canned fruit in natural juice (without  added sugar). Meat and other protein foods Skinless chicken or turkey. Ground chicken or turkey. Pork with fat trimmed off. Fish and seafood. Egg whites. Dried beans, peas, or lentils. Unsalted nuts, nut butters, and seeds. Unsalted canned beans. Lean cuts of beef with fat trimmed off. Low-sodium, lean deli meat. Dairy Low-fat (1%) or fat-free (skim) milk. Fat-free, low-fat, or reduced-fat cheeses. Nonfat, low-sodium ricotta or cottage cheese. Low-fat or nonfat yogurt. Low-fat, low-sodium cheese. Fats and oils Soft margarine without trans fats. Vegetable oil. Low-fat, reduced-fat, or light mayonnaise and salad dressings (reduced-sodium). Canola, safflower, olive, soybean, and sunflower oils. Avocado. Seasoning and other foods Herbs. Spices. Seasoning mixes without salt. Unsalted popcorn and pretzels. Fat-free sweets. What foods are not recommended? The items listed may not be a complete list. Talk with your dietitian about what dietary choices are best for you. Grains Baked goods made with fat, such as croissants, muffins, or some breads. Dry pasta or rice meal packs. Vegetables Creamed or fried vegetables. Vegetables in a cheese sauce. Regular canned vegetables (not low-sodium or reduced-sodium). Regular canned tomato sauce and paste (not low-sodium or reduced-sodium). Regular tomato and vegetable juice (not low-sodium or reduced-sodium). Pickles. Olives. Fruits Canned fruit in a light or heavy syrup. Fried fruit. Fruit in cream or butter sauce. Meat and other protein foods Fatty cuts of meat. Ribs. Fried meat. Bacon. Sausage. Bologna and other processed lunch meats. Salami. Fatback. Hotdogs. Bratwurst. Salted nuts and seeds. Canned beans with added salt. Canned or smoked fish. Whole eggs or egg yolks. Chicken or turkey with skin. Dairy Whole or 2% milk, cream, and half-and-half. Whole or full-fat cream cheese. Whole-fat or sweetened yogurt. Full-fat cheese. Nondairy creamers. Whipped toppings.  Processed cheese and cheese spreads. Fats and oils Butter. Stick margarine. Lard. Shortening. Ghee. Bacon fat. Tropical oils, such as coconut, palm kernel, or palm oil. Seasoning and other foods Salted popcorn and pretzels. Onion salt, garlic salt, seasoned salt, table salt, and sea salt. Worcestershire sauce. Tartar sauce. Barbecue sauce. Teriyaki sauce. Soy sauce, including reduced-sodium. Steak sauce. Canned and packaged gravies. Fish sauce. Oyster sauce. Cocktail sauce. Horseradish that you find on the shelf. Ketchup. Mustard. Meat flavorings and tenderizers. Bouillon cubes. Hot sauce and Tabasco sauce. Premade or packaged marinades. Premade or packaged taco seasonings. Relishes. Regular salad dressings. Where to find more information:  National Heart, Lung, and Blood Institute: www.nhlbi.nih.gov  American Heart Association: www.heart.org Summary  The DASH eating plan is a healthy eating plan that has been shown to reduce high blood pressure (hypertension). It may also reduce your risk for type 2 diabetes, heart disease, and stroke.  With the DASH eating plan, you should limit salt (sodium) intake to 2,300 mg a day. If you have hypertension, you may need to reduce your sodium intake to 1,500 mg a day.  When on the DASH eating plan, aim to eat more fresh fruits and vegetables, whole grains, lean proteins, low-fat dairy, and heart-healthy fats.  Work with your health care provider or diet and nutrition specialist (dietitian) to adjust your eating plan to your   individual calorie needs. This information is not intended to replace advice given to you by your health care provider. Make sure you discuss any questions you have with your health care provider. Document Revised: 01/06/2017 Document Reviewed: 01/18/2016 Elsevier Patient Education  2020 Elsevier Inc.  

## 2020-02-04 NOTE — Assessment & Plan Note (Signed)
Chronic, stable with BP below goal in office today and at previous visits.  Discussed at length with her, will trial changing regimen to Lisinopril only.  Stop HCTZ.  Script for Lisinopril 20 MG sent and will further reduce next visit if ongoing stable BP.  She agrees with this plan.  Recommend she monitor BP at home at least 3 mornings a week and document + focus on DASH diet.  Return in 6 months for physical.

## 2020-02-04 NOTE — Progress Notes (Signed)
BP 97/64    Pulse 73    Temp 98.5 F (36.9 C) (Oral)    Ht 5' 3.86" (1.622 m)    Wt 148 lb 12.8 oz (67.5 kg)    LMP 10/07/2016 (Approximate)    SpO2 98%    BMI 25.66 kg/m    Subjective:    Patient ID: Alicia Copeland, female    DOB: 08/15/1960, 59 y.o.   MRN: [id number]  HPI: Alicia Copeland is a 59 y.o. female  Chief Complaint  Patient presents with   Hypertension   HYPERTENSION Continues on Lisinopril-HCTZ without issue or ADR.Discussed with her whether need for medication is present -- as BP on lower side today.  Has not taken medication this morning. Hypertension status:stable Satisfied with current treatment?yes Duration of hypertension:chronic BP monitoring frequency:not checking BP range: BP medication side effects:no Medication compliance:good compliance Aspirin:no Recurrent headaches:no Visual changes:no Palpitations:no Dyspnea:no Chest pain:no Lower extremity edema:no Dizzy/lightheaded:none current -- years ago The 10-year ASCVD risk score Mikey Bussing DC Jr., et al., 2013) is: 3.2%   Values used to calculate the score:     Age: 63 years     Sex: Female     Is Non-Hispanic African American: No     Diabetic: No     Tobacco smoker: No     Systolic Blood Pressure: 97 mmHg     Is BP treated: Yes     HDL Cholesterol: 41 mg/dL     Total Cholesterol: 247 mg/dL  Relevant past medical, surgical, family and social history reviewed and updated as indicated. Interim medical history since our last visit reviewed. Allergies and medications reviewed and updated.  Review of Systems  Constitutional: Negative for activity change, appetite change, diaphoresis, fatigue and fever.  Respiratory: Negative for cough, chest tightness and shortness of breath.   Cardiovascular: Negative for chest pain, palpitations and leg swelling.  Gastrointestinal: Negative.   Neurological: Negative.   Psychiatric/Behavioral: Negative.     Per HPI unless specifically  indicated above     Objective:    BP 97/64    Pulse 73    Temp 98.5 F (36.9 C) (Oral)    Ht 5' 3.86" (1.622 m)    Wt 148 lb 12.8 oz (67.5 kg)    LMP 10/07/2016 (Approximate)    SpO2 98%    BMI 25.66 kg/m   Wt Readings from Last 3 Encounters:  02/04/20 148 lb 12.8 oz (67.5 kg)  07/29/19 149 lb 6.4 oz (67.8 kg)  02/19/18 148 lb (67.1 kg)    Physical Exam Vitals and nursing note reviewed.  Constitutional:      General: She is awake. She is not in acute distress.    Appearance: She is well-developed and well-groomed. She is not ill-appearing.  HENT:     Head: Normocephalic.     Right Ear: Hearing normal.     Left Ear: Hearing normal.  Eyes:     General: Lids are normal.        Right eye: No discharge.        Left eye: No discharge.     Conjunctiva/sclera: Conjunctivae normal.     Pupils: Pupils are equal, round, and reactive to light.  Neck:     Thyroid: No thyromegaly.     Vascular: No carotid bruit.  Cardiovascular:     Rate and Rhythm: Normal rate and regular rhythm.     Heart sounds: Normal heart sounds. No murmur heard. No gallop.   Pulmonary:  Effort: Pulmonary effort is normal. No accessory muscle usage or respiratory distress.     Breath sounds: Normal breath sounds.  Abdominal:     General: Bowel sounds are normal.     Palpations: Abdomen is soft. There is no hepatomegaly or splenomegaly.  Musculoskeletal:     Cervical back: Normal range of motion and neck supple.     Right lower leg: No edema.     Left lower leg: No edema.  Skin:    General: Skin is warm and dry.  Neurological:     Mental Status: She is alert and oriented to person, place, and time.  Psychiatric:        Attention and Perception: Attention normal.        Mood and Affect: Mood normal.        Speech: Speech normal.        Behavior: Behavior normal. Behavior is cooperative.        Thought Content: Thought content normal.     Results for orders placed or performed in visit on 07/29/19   CBC with Differential/Platelet  Result Value Ref Range   WBC 8.4 3.4 - 10.8 x10E3/uL   RBC 4.60 3.77 - 5.28 x10E6/uL   Hemoglobin 13.5 11.1 - 15.9 g/dL   Hematocrit 41.0 34.0 - 46.6 %   MCV 89 79 - 97 fL   MCH 29.3 26.6 - 33.0 pg   MCHC 32.9 31.5 - 35.7 g/dL   RDW 13.9 11.7 - 15.4 %   Platelets 225 150 - 450 x10E3/uL   Neutrophils 60 Not Estab. %   Lymphs 33 Not Estab. %   Monocytes 5 Not Estab. %   Eos 2 Not Estab. %   Basos 0 Not Estab. %   Neutrophils Absolute 5.0 1.4 - 7.0 x10E3/uL   Lymphocytes Absolute 2.7 0.7 - 3.1 x10E3/uL   Monocytes Absolute 0.5 0.1 - 0.9 x10E3/uL   EOS (ABSOLUTE) 0.1 0.0 - 0.4 x10E3/uL   Basophils Absolute 0.0 0.0 - 0.2 x10E3/uL   Immature Granulocytes 0 Not Estab. %   Immature Grans (Abs) 0.0 0.0 - 0.1 x10E3/uL  Comprehensive metabolic panel  Result Value Ref Range   Glucose 81 65 - 99 mg/dL   BUN 14 6 - 24 mg/dL   Creatinine, Ser 0.90 0.57 - 1.00 mg/dL   GFR calc non Af Amer 71 >59 mL/min/1.73   GFR calc Af Amer 82 >59 mL/min/1.73   BUN/Creatinine Ratio 16 9 - 23   Sodium 140 134 - 144 mmol/L   Potassium 4.8 3.5 - 5.2 mmol/L   Chloride 99 96 - 106 mmol/L   CO2 25 20 - 29 mmol/L   Calcium 10.1 8.7 - 10.2 mg/dL   Total Protein 7.2 6.0 - 8.5 g/dL   Albumin 4.8 3.8 - 4.9 g/dL   Globulin, Total 2.4 1.5 - 4.5 g/dL   Albumin/Globulin Ratio 2.0 1.2 - 2.2   Bilirubin Total 0.4 0.0 - 1.2 mg/dL   Alkaline Phosphatase 99 48 - 121 IU/L   AST 19 0 - 40 IU/L   ALT 21 0 - 32 IU/L  Lipid Panel w/o Chol/HDL Ratio  Result Value Ref Range   Cholesterol, Total 247 (H) 100 - 199 mg/dL   Triglycerides 230 (H) 0 - 149 mg/dL   HDL 41 >39 mg/dL   VLDL Cholesterol Cal 43 (H) 5 - 40 mg/dL   LDL Chol Calc (NIH) 163 (H) 0 - 99 mg/dL  TSH  Result Value Ref Range  TSH 2.390 0.450 - 4.500 uIU/mL      Assessment & Plan:   Problem List Items Addressed This Visit      Cardiovascular and Mediastinum   Hypertension    Chronic, stable with BP below goal in  office today and at previous visits.  Discussed at length with her, will trial changing regimen to Lisinopril only.  Stop HCTZ.  Script for Lisinopril 20 MG sent and will further reduce next visit if ongoing stable BP.  She agrees with this plan.  Recommend she monitor BP at home at least 3 mornings a week and document + focus on DASH diet.  Return in 6 months for physical.      Relevant Medications   lisinopril (ZESTRIL) 20 MG tablet     Other   BMI 25.0-25.9,adult    Continue focus on healthy diet and regular activity.          Follow up plan: Return in about 6 months (around 08/04/2020) for Annual physical with possible pap.

## 2020-06-02 ENCOUNTER — Ambulatory Visit: Payer: BC Managed Care – PPO | Admitting: Nurse Practitioner

## 2020-06-02 ENCOUNTER — Encounter: Payer: Self-pay | Admitting: Nurse Practitioner

## 2020-06-02 ENCOUNTER — Other Ambulatory Visit: Payer: Self-pay

## 2020-06-02 VITALS — BP 136/85 | HR 82 | Temp 98.6°F | Wt 145.4 lb

## 2020-06-02 DIAGNOSIS — L03211 Cellulitis of face: Secondary | ICD-10-CM | POA: Diagnosis not present

## 2020-06-02 DIAGNOSIS — Z8616 Personal history of COVID-19: Secondary | ICD-10-CM | POA: Insufficient documentation

## 2020-06-02 DIAGNOSIS — L0390 Cellulitis, unspecified: Secondary | ICD-10-CM | POA: Insufficient documentation

## 2020-06-02 MED ORDER — VALACYCLOVIR HCL 1 G PO TABS: 1000.0000 mg | ORAL_TABLET | Freq: Two times a day (BID) | ORAL | 0 refills | Status: AC

## 2020-06-02 MED ORDER — MUPIROCIN 2 % EX OINT: 1.0000 "application " | TOPICAL_OINTMENT | Freq: Two times a day (BID) | CUTANEOUS | 0 refills | Status: DC

## 2020-06-02 MED ORDER — DOXYCYCLINE HYCLATE 100 MG PO TABS: 100.0000 mg | ORAL_TABLET | Freq: Two times a day (BID) | ORAL | 0 refills | Status: AC

## 2020-06-02 NOTE — Progress Notes (Signed)
BP 136/85   Pulse 82   Temp 98.6 F (37 C) (Oral)   Wt 145 lb 6.4 oz (66 kg)   LMP 10/07/2016 (Approximate)   SpO2 96%   BMI 25.07 kg/m    Subjective:    Patient ID: Alicia Copeland, female    DOB: [birth year], 60 y.o.   MRN: [id number]  HPI: Alicia Copeland is a 60 y.o. female  Chief Complaint  Patient presents with  . Blister    Patient is here for a possible fever blister or blister in the chin area. Patient states it started as a little cluster of bumps and she is a picker and she was picking at the area. Patient states it has gotten worse since Saturday and she wanted to noticed if it could be possibly be covid related as she had recently tested positive the week of April 8th and read where about drooling and she noticed when she would wake up she had drooled in that area.    SKIN LESION Noticed a fever blister in the chin area, started late Thursday/early Friday.  Started as cluster of tiny bumps and she picked these.  When it crusted over she does endorse picking at area.  Area is hard, swollen, and hurts.  Has clear oozing at times.  Feels pain on left side of face up to ear.  No other rashes noted.  Does not have shingles vaccinations.  Recently had positive Covid April 8th.  Continues to have some ongoing fatigue and headaches, dry cough with this.   Duration: days Location: left side of chin History of trauma in area: no Pain: yes Quality: yes Severity: mild Redness: yes Swelling: yes Oozing: yes Pus: no Fevers: no Nausea/vomiting: yes Status: fluctuating Treatments attempted:warm compresses  Tetanus: refuses, last 2005  Relevant past medical, surgical, family and social history reviewed and updated as indicated. Interim medical history since our last visit reviewed. Allergies and medications reviewed and updated.  Review of Systems  Constitutional: Negative.   Respiratory: Negative.   Cardiovascular: Negative.   Skin: Positive for rash.  Neurological:  Negative.   Psychiatric/Behavioral: Negative.     Per HPI unless specifically indicated above     Objective:    BP 136/85   Pulse 82   Temp 98.6 F (37 C) (Oral)   Wt 145 lb 6.4 oz (66 kg)   LMP 10/07/2016 (Approximate)   SpO2 96%   BMI 25.07 kg/m   Wt Readings from Last 3 Encounters:  06/02/20 145 lb 6.4 oz (66 kg)  02/04/20 148 lb 12.8 oz (67.5 kg)  07/29/19 149 lb 6.4 oz (67.8 kg)    Physical Exam Vitals and nursing note reviewed.  Constitutional:      General: She is awake. She is not in acute distress.    Appearance: She is well-developed and well-groomed. She is not ill-appearing.  HENT:     Head: Normocephalic.     Right Ear: Hearing normal.     Left Ear: Hearing normal.  Eyes:     General: Lids are normal.        Right eye: No discharge.        Left eye: No discharge.     Conjunctiva/sclera: Conjunctivae normal.     Pupils: Pupils are equal, round, and reactive to light.  Neck:     Thyroid: No thyromegaly.     Vascular: No carotid bruit.  Cardiovascular:     Rate and Rhythm: Normal rate and regular rhythm.  Heart sounds: Normal heart sounds. No murmur heard. No gallop.   Pulmonary:     Effort: Pulmonary effort is normal. No accessory muscle usage or respiratory distress.     Breath sounds: Normal breath sounds.  Abdominal:     General: Bowel sounds are normal.     Palpations: Abdomen is soft. There is no hepatomegaly or splenomegaly.  Musculoskeletal:     Cervical back: Normal range of motion and neck supple.     Right lower leg: No edema.     Left lower leg: No edema.  Lymphadenopathy:     Head:     Right side of head: No submental, submandibular, tonsillar, preauricular or posterior auricular adenopathy.     Left side of head: Submandibular adenopathy present. No submental, tonsillar, preauricular or posterior auricular adenopathy.     Cervical: No cervical adenopathy.  Skin:    General: Skin is warm and dry.     Findings: Abscess and wound  present.          Comments: No other rashes or wounds noted upper extremities.  Neurological:     Mental Status: She is alert and oriented to person, place, and time.  Psychiatric:        Attention and Perception: Attention normal.        Mood and Affect: Mood normal.        Speech: Speech normal.        Behavior: Behavior normal. Behavior is cooperative.        Thought Content: Thought content normal.    Results for orders placed or performed in visit on 07/29/19  CBC with Differential/Platelet  Result Value Ref Range   WBC 8.4 3.4 - 10.8 x10E3/uL   RBC 4.60 3.77 - 5.28 x10E6/uL   Hemoglobin 13.5 11.1 - 15.9 g/dL   Hematocrit 41.0 34.0 - 46.6 %   MCV 89 79 - 97 fL   MCH 29.3 26.6 - 33.0 pg   MCHC 32.9 31.5 - 35.7 g/dL   RDW 13.9 11.7 - 15.4 %   Platelets 225 150 - 450 x10E3/uL   Neutrophils 60 Not Estab. %   Lymphs 33 Not Estab. %   Monocytes 5 Not Estab. %   Eos 2 Not Estab. %   Basos 0 Not Estab. %   Neutrophils Absolute 5.0 1.4 - 7.0 x10E3/uL   Lymphocytes Absolute 2.7 0.7 - 3.1 x10E3/uL   Monocytes Absolute 0.5 0.1 - 0.9 x10E3/uL   EOS (ABSOLUTE) 0.1 0.0 - 0.4 x10E3/uL   Basophils Absolute 0.0 0.0 - 0.2 x10E3/uL   Immature Granulocytes 0 Not Estab. %   Immature Grans (Abs) 0.0 0.0 - 0.1 x10E3/uL  Comprehensive metabolic panel  Result Value Ref Range   Glucose 81 65 - 99 mg/dL   BUN 14 6 - 24 mg/dL   Creatinine, Ser 0.90 0.57 - 1.00 mg/dL   GFR calc non Af Amer 71 >59 mL/min/1.73   GFR calc Af Amer 82 >59 mL/min/1.73   BUN/Creatinine Ratio 16 9 - 23   Sodium 140 134 - 144 mmol/L   Potassium 4.8 3.5 - 5.2 mmol/L   Chloride 99 96 - 106 mmol/L   CO2 25 20 - 29 mmol/L   Calcium 10.1 8.7 - 10.2 mg/dL   Total Protein 7.2 6.0 - 8.5 g/dL   Albumin 4.8 3.8 - 4.9 g/dL   Globulin, Total 2.4 1.5 - 4.5 g/dL   Albumin/Globulin Ratio 2.0 1.2 - 2.2   Bilirubin Total 0.4 0.0 - 1.2  mg/dL   Alkaline Phosphatase 99 48 - 121 IU/L   AST 19 0 - 40 IU/L   ALT 21 0 - 32 IU/L   Lipid Panel w/o Chol/HDL Ratio  Result Value Ref Range   Cholesterol, Total 247 (H) 100 - 199 mg/dL   Triglycerides 230 (H) 0 - 149 mg/dL   HDL 41 >39 mg/dL   VLDL Cholesterol Cal 43 (H) 5 - 40 mg/dL   LDL Chol Calc (NIH) 163 (H) 0 - 99 mg/dL  TSH  Result Value Ref Range   TSH 2.390 0.450 - 4.500 uIU/mL      Assessment & Plan:   Problem List Items Addressed This Visit      Other   Cellulitis - Primary    To left side of chin area -- ?shingles vs cold sore.  Exacerbated by patient picking at area.  Now with s/s infection present.  At this time will treat with Valtrex 1000 MG BID x 10 days and Doxycycline 100 MG BID x 10 days + Mupirocin ointment to area BID.  Have highly advised patient not to pick at area.  She is to apply warm compresses to area 3-4 times a day, allow to drain on own if present.  May take Tylenol as needed for discomfort. Return in one week for follow-up, sooner if worsening symptoms or further areas of break out noted.  Currently no other rashes present.      History of 2019 novel coronavirus disease (COVID-19)    On 05/15/20 tested positive, at this time is improving with mild lingering fatigue and cough.  Continue to monitor closely and obtain imaging/labs if any worsening or ongoing symptoms.  Educated her on post-Covid symptoms and recovery period.          Follow up plan: Return in about 1 week (around 06/09/2020) for Wound check.

## 2020-06-02 NOTE — Patient Instructions (Signed)

## 2020-06-02 NOTE — Assessment & Plan Note (Signed)
To left side of chin area -- ?shingles vs cold sore.  Exacerbated by patient picking at area.  Now with s/s infection present.  At this time will treat with Valtrex 1000 MG BID x 10 days and Doxycycline 100 MG BID x 10 days + Mupirocin ointment to area BID.  Have highly advised patient not to pick at area.  She is to apply warm compresses to area 3-4 times a day, allow to drain on own if present.  May take Tylenol as needed for discomfort. Return in one week for follow-up, sooner if worsening symptoms or further areas of break out noted.  Currently no other rashes present.

## 2020-06-02 NOTE — Assessment & Plan Note (Signed)
On 05/15/20 tested positive, at this time is improving with mild lingering fatigue and cough.  Continue to monitor closely and obtain imaging/labs if any worsening or ongoing symptoms.  Educated her on post-Covid symptoms and recovery period.

## 2020-06-09 ENCOUNTER — Other Ambulatory Visit: Payer: Self-pay

## 2020-06-09 ENCOUNTER — Encounter: Payer: Self-pay | Admitting: Nurse Practitioner

## 2020-06-09 ENCOUNTER — Ambulatory Visit: Payer: BC Managed Care – PPO | Admitting: Nurse Practitioner

## 2020-06-09 VITALS — BP 131/80 | HR 89 | Temp 98.5°F | Wt 146.8 lb

## 2020-06-09 DIAGNOSIS — L03211 Cellulitis of face: Secondary | ICD-10-CM | POA: Diagnosis not present

## 2020-06-09 NOTE — Progress Notes (Signed)
Acute Office Visit  Subjective:    Patient ID: Alicia Copeland, female    DOB: 1960-11-03, 60 y.o.   MRN: [id number]  Chief Complaint  Patient presents with  . Wound Check    1 week f/up- still taking doxy and valtrex    HPI  Patient is in today for follow-up of cellulitis on her chin. Area has scabbed on her left chin. She is trying not to pick at it. She has been taking her valtrex, doxycycline, and mupirocin. Her pain has improved significantly and now only has an ache in her chin once in a while. Has noted some itching in her ears that has improved as well. Denies fevers, abdominal pain, nausea, and vomiting.   Past Medical History:  Diagnosis Date  . 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  . Hypertension 2009  . Lump or mass in breast 2011  . Special screening for malignant neoplasms, colon     Past Surgical History:  Procedure Laterality Date  . BREAST BIOPSY Right 2007   Fibroadenoma, core biopsy, ER/PR negative.done in [place]  . BREAST SURGERY Right 03/11/2010   Reexcision right breast lesion at the 10:00 position with no evidence of residual flow in tumor  . BREAST SURGERY Right 02/04/2010   Right breast re-excision, no evidence of recurrent phyllodes tumor  . COLONOSCOPY  2012   Dr. Candace Cruise    Family History  Problem Relation Age of Onset  . Diabetes Mother   . Breast cancer Mother   . Colon cancer Mother        breast cancer, lymphoma, greater than age 69  . Cancer Maternal Grandmother 67       colon cancer, greater than age 75  . Cancer Paternal Grandmother        breast cancer, greater than age 98  . Breast cancer Paternal Grandmother   . Hypertension Father   . Diabetes Father   . Heart attack Father   . Hypertension Brother   . Heart attack  Maternal Grandfather     Social History   Socioeconomic History  . Marital status: Married    Spouse name: Not on file  . Number of children: Not on file  . Years of education: Not on file  . Highest education level: Not on file  Occupational History  . Not on file  Tobacco Use  . Smoking status: Never Smoker  . Smokeless tobacco: Never Used  Vaping Use  . Vaping Use: Never used  Substance and Sexual Activity  . Alcohol use: No  . Drug use: No  . Sexual activity: Yes    Birth control/protection: Post-menopausal  Other Topics Concern  . Not on file  Social History Narrative  . Not on file   Social Determinants of Health   Financial Resource Strain: Not on file  Food Insecurity: Not on file  Transportation Needs: Not on file  Physical Activity: Not on file  Stress: Not on file  Social Connections: Not on file  Intimate Partner Violence: Not on file    Outpatient Medications Prior to Visit  Medication Sig Dispense Refill  . doxycycline (VIBRA-TABS) 100 MG tablet Take 1 tablet (100 mg total) by mouth 2 (two) times daily for 10 days. 20 tablet 0  . lisinopril (ZESTRIL) 20 MG  tablet Take 1 tablet (20 mg total) by mouth daily. 90 tablet 3  . mupirocin ointment (BACTROBAN) 2 % Apply 1 application topically 2 (two) times daily. 22 g 0  . valACYclovir (VALTREX) 1000 MG tablet Take 1 tablet (1,000 mg total) by mouth 2 (two) times daily for 10 days. 20 tablet 0  . diclofenac sodium (VOLTAREN) 1 % GEL Apply 2 g topically 4 (four) times daily. (Patient not taking: Reported on 06/02/2020) 100 g 1   No facility-administered medications prior to visit.    Allergies  Allergen Reactions  . Sulfa Antibiotics Hives and Other (See Comments)    Alters liver function. "breaks out in liver"    Review of Systems  HENT: Negative.   Respiratory: Negative.   Cardiovascular: Negative.   Gastrointestinal: Negative.   Skin: Positive for rash and wound.       improving  Neurological:  Negative.        Objective:    Physical Exam Vitals and nursing note reviewed.  Constitutional:      General: She is not in acute distress.    Appearance: Normal appearance.  HENT:     Head: Normocephalic.     Right Ear: Tympanic membrane, ear canal and external ear normal.     Left Ear: Tympanic membrane, ear canal and external ear normal.  Eyes:     Conjunctiva/sclera: Conjunctivae normal.  Pulmonary:     Effort: Pulmonary effort is normal.  Skin:    General: Skin is warm.     Findings: Lesion present.     Comments: Scabbed area to left chin with mild erythema around the surrounding skin. No edema or drainage noted.   Neurological:     General: No focal deficit present.     Mental Status: She is alert and oriented to person, place, and time.  Psychiatric:        Mood and Affect: Mood normal.        Behavior: Behavior normal.        Thought Content: Thought content normal.        Judgment: Judgment normal.     BP 131/80 (BP Location: Right Arm, Cuff Size: Normal)   Pulse 89   Temp 98.5 F (36.9 C) (Oral)   Wt 146 lb 12.8 oz (66.6 kg)   LMP 10/07/2016 (Approximate)   SpO2 98%   BMI 25.31 kg/m  Wt Readings from Last 3 Encounters:  06/09/20 146 lb 12.8 oz (66.6 kg)  06/02/20 145 lb 6.4 oz (66 kg)  02/04/20 148 lb 12.8 oz (67.5 kg)    Health Maintenance Due  Topic Date Due  . COVID-19 Vaccine (3 - Booster for Pfizer series) 11/08/2019    There are no preventive care reminders to display for this patient.   Lab Results  Component Value Date   TSH 2.390 07/29/2019   Lab Results  Component Value Date   WBC 8.4 07/29/2019   HGB 13.5 07/29/2019   HCT 41.0 07/29/2019   MCV 89 07/29/2019   PLT 225 07/29/2019   Lab Results  Component Value Date   NA 140 07/29/2019   K 4.8 07/29/2019   CO2 25 07/29/2019   GLUCOSE 81 07/29/2019   BUN 14 07/29/2019   CREATININE 0.90 07/29/2019   BILITOT 0.4 07/29/2019   ALKPHOS 99 07/29/2019   AST 19 07/29/2019   ALT  21 07/29/2019   PROT 7.2 07/29/2019   ALBUMIN 4.8 07/29/2019   CALCIUM 10.1 07/29/2019   Lab Results  Component Value Date   CHOL 247 (H) 07/29/2019   Lab Results  Component Value Date   HDL 41 07/29/2019   Lab Results  Component Value Date   LDLCALC 163 (H) 07/29/2019   Lab Results  Component Value Date   TRIG 230 (H) 07/29/2019   No results found for: CHOLHDL No results found for: HGBA1C     Assessment & Plan:   Problem List Items Addressed This Visit      Other   Cellulitis - Primary    Improving. Lesion to left chin has scabbed over. No drainage or edema. Continue antibiotics for full course. Discussed not picking at scab. Also discussed shingrix vaccination after her symptoms resolve. Follow-up if symptoms worsen or pain returns.           No orders of the defined types were placed in this encounter.    Charyl Dancer, NP

## 2020-06-09 NOTE — Assessment & Plan Note (Addendum)
Improving. Lesion to left chin has scabbed over. No drainage or edema. Continue antibiotics for full course. Discussed not picking at scab. Also discussed shingrix vaccination after her symptoms resolve. Follow-up if symptoms worsen or pain returns.

## 2020-06-09 NOTE — Patient Instructions (Signed)
Zoster Vaccine, Recombinant injection What is this medicine? ZOSTER VACCINE (ZOS ter vak SEEN) is a vaccine used to reduce the risk of getting shingles. This vaccine is not used to treat shingles or nerve pain from shingles. This medicine may be used for other purposes; ask your health care provider or pharmacist if you have questions. COMMON BRAND NAME(S): [facility] What should I tell my health care provider before I take this medicine? They need to know if you have any of these conditions:  cancer  immune system problems  an unusual or allergic reaction to Zoster vaccine, other medications, foods, dyes, or preservatives  pregnant or trying to get pregnant  breast-feeding How should I use this medicine? This vaccine is injected into a muscle. It is given by a health care provider. A copy of Vaccine Information Statements will be given before each vaccination. Be sure to read this information carefully each time. This sheet may change often. Talk to your health care provider about the use of this vaccine in children. This vaccine is not approved for use in children. Overdosage: If you think you have taken too much of this medicine contact a poison control center or emergency room at once. NOTE: This medicine is only for you. Do not share this medicine with others. What if I miss a dose? Keep appointments for follow-up (booster) doses. It is important not to miss your dose. Call your health care provider if you are unable to keep an appointment. What may interact with this medicine?  medicines that suppress your immune system  medicines to treat cancer  steroid medicines like prednisone or cortisone This list may not describe all possible interactions. Give your health care provider a list of all the medicines, herbs, non-prescription drugs, or dietary supplements you use. Also tell them if you smoke, drink alcohol, or use illegal drugs. Some items may interact with your medicine. What  should I watch for while using this medicine? Visit your health care provider regularly. This vaccine, like all vaccines, may not fully protect everyone. What side effects may I notice from receiving this medicine? Side effects that you should report to your doctor or health care professional as soon as possible:  allergic reactions (skin rash, itching or hives; swelling of the face, lips, or tongue)  trouble breathing Side effects that usually do not require medical attention (report these to your doctor or health care professional if they continue or are bothersome):  chills  headache  fever  nausea  pain, redness, or irritation at site where injected  tiredness  vomiting This list may not describe all possible side effects. Call your doctor for medical advice about side effects. You may report side effects to FDA at 1-800-FDA-1088. Where should I keep my medicine? This vaccine is only given by a health care provider. It will not be stored at home. NOTE: This sheet is a summary. It may not cover all possible information. If you have questions about this medicine, talk to your doctor, pharmacist, or health care provider.  2021 Elsevier/Gold Standard (2019-03-01 16:23:07)

## 2020-08-04 ENCOUNTER — Other Ambulatory Visit: Payer: Self-pay

## 2020-08-04 ENCOUNTER — Ambulatory Visit: Payer: BC Managed Care – PPO | Admitting: Nurse Practitioner

## 2020-08-04 ENCOUNTER — Encounter: Payer: Self-pay | Admitting: Nurse Practitioner

## 2020-08-04 VITALS — BP 134/82 | HR 80 | Temp 98.5°F | Wt 150.0 lb

## 2020-08-04 DIAGNOSIS — Z114 Encounter for screening for human immunodeficiency virus [HIV]: Secondary | ICD-10-CM

## 2020-08-04 DIAGNOSIS — Z0000 Encounter for general adult medical examination without abnormal findings: Secondary | ICD-10-CM

## 2020-08-04 DIAGNOSIS — I10 Essential (primary) hypertension: Secondary | ICD-10-CM | POA: Diagnosis not present

## 2020-08-04 DIAGNOSIS — Z6825 Body mass index (BMI) 25.0-25.9, adult: Secondary | ICD-10-CM | POA: Diagnosis not present

## 2020-08-04 DIAGNOSIS — Z1211 Encounter for screening for malignant neoplasm of colon: Secondary | ICD-10-CM

## 2020-08-04 DIAGNOSIS — Z1159 Encounter for screening for other viral diseases: Secondary | ICD-10-CM | POA: Diagnosis not present

## 2020-08-04 DIAGNOSIS — Z1231 Encounter for screening mammogram for malignant neoplasm of breast: Secondary | ICD-10-CM

## 2020-08-04 NOTE — Assessment & Plan Note (Signed)
Recommended eating smaller high protein, low fat meals more frequently and exercising 30 mins a day 5 times a week with a goal of 10-15lb weight loss in the next 3 months. Patient voiced their understanding and motivation to adhere to these recommendations.  

## 2020-08-04 NOTE — Assessment & Plan Note (Signed)
Chronic, stable with BP below goal in office today on recheck.  Continue Lisinopril 20 MG daily, HCTZ stopped last visit due to low NA+.  Recommend she monitor BP at home at least 3 mornings a week and document + focus on DASH diet.  LABS: CMP, CBC, TSH, Lipid.  Return in 6 months for follow-up.

## 2020-08-04 NOTE — Progress Notes (Signed)
BP 134/82 (BP Location: Right Arm)   Pulse 80   Temp 98.5 F (36.9 C) (Oral)   Wt 150 lb (68 kg)   LMP 10/07/2016 (Approximate)   SpO2 96%   BMI 25.86 kg/m    Subjective:    Patient ID: Alicia Copeland, female    DOB: 1960-04-23, 60 y.o.   MRN: [id number]  HPI: Alicia Copeland is a 60 y.o. female presenting on 08/04/2020 for comprehensive medical examination. Current medical complaints include:none  She currently lives with:spouse Menopausal Symptoms: no  HYPERTENSION Continues on Lisinopril 20 MG.   Hypertension status: stable  Satisfied with current treatment? yes Duration of hypertension: chronic BP monitoring frequency: not checking BP range:  BP medication side effects:  no Medication compliance: good compliance Aspirin: no Recurrent headaches: no Visual changes: no Palpitations: no Dyspnea: no Chest pain: no Lower extremity edema: no Dizzy/lightheaded: no The 10-year ASCVD risk score Mikey Bussing DC Jr., et al., 2013) is: 6.4%   Values used to calculate the score:     Age: 73 years     Sex: Female     Is Non-Hispanic African American: No     Diabetic: No     Tobacco smoker: No     Systolic Blood Pressure: 710 mmHg     Is BP treated: Yes     HDL Cholesterol: 41 mg/dL     Total Cholesterol: 247 mg/dL   Depression Screen done today and results listed below:  Depression screen [facility] 2/9 08/04/2020 02/04/2020 07/29/2019 01/08/2019 06/30/2017  Decreased Interest 0 0 0 0 0  Down, Depressed, Hopeless 0 0 0 0 0  PHQ - 2 Score 0 0 0 0 0  Altered sleeping - - - - -  Tired, decreased energy - - - - -  Change in appetite - - - - -  Feeling bad or failure about yourself  - - - - -  Trouble concentrating - - - - -  Moving slowly or fidgety/restless - - - - -  Suicidal thoughts - - - - -  PHQ-9 Score - - - - -    The patient does not have a history of falls. I did not complete a risk assessment for falls. A plan of care for falls was not documented.   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   Hypertension 2009   Lump or mass in breast 2011   Special screening for malignant neoplasms, colon     Surgical History:  Past Surgical History:  Procedure Laterality Date   BREAST BIOPSY Right 2007   Fibroadenoma, core biopsy, ER/PR negative.done in [place] Right 03/11/2010   Reexcision right breast lesion at the 10:00 position with no evidence of residual flow in tumor   BREAST SURGERY Right 02/04/2010   Right breast re-excision, no evidence of recurrent phyllodes tumor   COLONOSCOPY  2012   Dr. Candace Cruise    Medications:  Current Outpatient Medications on File Prior to Visit  Medication Sig   lisinopril (ZESTRIL) 20 MG tablet Take 1 tablet (20 mg total) by mouth daily.   mupirocin ointment (BACTROBAN) 2 % Apply 1 application topically 2 (two) times daily.   No current facility-administered medications  on file prior to visit.    Allergies:  Allergies  Allergen Reactions   Sulfa Antibiotics Hives and Other (See Comments)    Alters liver function. "breaks out in liver"    Social History:  Social History   Socioeconomic History   Marital status: Married    Spouse name: Not on file   Number of children: Not on file   Years of education: Not on file   Highest education level: Not on file  Occupational History   Not on file  Tobacco Use   Smoking status: Never   Smokeless tobacco: Never  Vaping Use   Vaping Use: Never used  Substance and Sexual Activity   Alcohol use: No   Drug use: No   Sexual activity: Yes    Birth control/protection: Post-menopausal  Other Topics Concern   Not on file  Social History Narrative   Not on file   Social Determinants of  Health   Financial Resource Strain: Not on file  Food Insecurity: Not on file  Transportation Needs: Not on file  Physical Activity: Not on file  Stress: Not on file  Social Connections: Not on file  Intimate Partner Violence: Not on file   Social History   Tobacco Use  Smoking Status Never  Smokeless Tobacco Never   Social History   Substance and Sexual Activity  Alcohol Use No    Family History:  Family History  Problem Relation Age of Onset   Diabetes Mother    Breast cancer Mother    Colon cancer Mother        breast cancer, lymphoma, greater than age 14   Cancer Maternal Grandmother 33       colon cancer, greater than age 87   Cancer Paternal Grandmother        breast cancer, greater than age 79   Breast cancer Paternal Grandmother    Hypertension Father    Diabetes Father    Heart attack Father    Hypertension Brother    Heart attack Maternal Grandfather     Past medical history, surgical history, medications, allergies, family history and social history reviewed with patient today and changes made to appropriate areas of the chart.   ROS All other ROS negative except what is listed above and in the HPI.      Objective:    BP 134/82 (BP Location: Right Arm)   Pulse 80   Temp 98.5 F (36.9 C) (Oral)   Wt 150 lb (68 kg)   LMP 10/07/2016 (Approximate)   SpO2 96%   BMI 25.86 kg/m   Wt Readings from Last 3 Encounters:  08/04/20 150 lb (68 kg)  06/09/20 146 lb 12.8 oz (66.6 kg)  06/02/20 145 lb 6.4 oz (66 kg)    Physical Exam Vitals and nursing note reviewed. Exam conducted with a chaperone present.  Constitutional:      General: She is awake. She is not in acute distress.    Appearance: She is well-developed. She is not ill-appearing.  HENT:     Head: Normocephalic and atraumatic.     Right Ear: Hearing, tympanic membrane, ear canal and external ear normal. No drainage.     Left Ear: Hearing, tympanic membrane, ear canal and external ear normal.  No drainage.     Nose: Nose normal.     Right Sinus: No maxillary sinus tenderness or frontal sinus tenderness.     Left Sinus: No maxillary sinus tenderness or frontal sinus tenderness.  Mouth/Throat:     Mouth: Mucous membranes are moist.     Pharynx: Oropharynx is clear. Uvula midline. No pharyngeal swelling, oropharyngeal exudate or posterior oropharyngeal erythema.  Eyes:     General: Lids are normal.        Right eye: No discharge.        Left eye: No discharge.     Extraocular Movements: Extraocular movements intact.     Conjunctiva/sclera: Conjunctivae normal.     Pupils: Pupils are equal, round, and reactive to light.     Visual Fields: Right eye visual fields normal and left eye visual fields normal.  Neck:     Thyroid: No thyromegaly.     Vascular: No carotid bruit.     Trachea: Trachea normal.  Cardiovascular:     Rate and Rhythm: Normal rate and regular rhythm.     Heart sounds: Normal heart sounds. No murmur heard.   No gallop.  Pulmonary:     Effort: Pulmonary effort is normal. No accessory muscle usage or respiratory distress.     Breath sounds: Normal breath sounds.  Chest:  Breasts:    Right: Normal. No axillary adenopathy or supraclavicular adenopathy.     Left: Normal. No axillary adenopathy or supraclavicular adenopathy.  Abdominal:     General: Bowel sounds are normal.     Palpations: Abdomen is soft. There is no hepatomegaly or splenomegaly.     Tenderness: There is no abdominal tenderness.  Musculoskeletal:        General: Normal range of motion.     Cervical back: Normal range of motion and neck supple.     Right lower leg: No edema.     Left lower leg: No edema.  Lymphadenopathy:     Head:     Right side of head: No submental, submandibular, tonsillar, preauricular or posterior auricular adenopathy.     Left side of head: No submental, submandibular, tonsillar, preauricular or posterior auricular adenopathy.     Cervical: No cervical adenopathy.      Upper Body:     Right upper body: No supraclavicular, axillary or pectoral adenopathy.     Left upper body: No supraclavicular, axillary or pectoral adenopathy.  Skin:    General: Skin is warm and dry.     Capillary Refill: Capillary refill takes less than 2 seconds.     Findings: No rash.  Neurological:     Mental Status: She is alert and oriented to person, place, and time.     Cranial Nerves: Cranial nerves are intact.     Gait: Gait is intact.     Deep Tendon Reflexes: Reflexes are normal and symmetric.     Reflex Scores:      Brachioradialis reflexes are 2+ on the right side and 2+ on the left side.      Patellar reflexes are 2+ on the right side and 2+ on the left side. Psychiatric:        Attention and Perception: Attention normal.        Mood and Affect: Mood normal.        Speech: Speech normal.        Behavior: Behavior normal. Behavior is cooperative.        Thought Content: Thought content normal.        Judgment: Judgment normal.    Results for orders placed or performed in visit on 07/29/19  CBC with Differential/Platelet  Result Value Ref Range   WBC 8.4 3.4 - 10.8 x10E3/uL  RBC 4.60 3.77 - 5.28 x10E6/uL   Hemoglobin 13.5 11.1 - 15.9 g/dL   Hematocrit 41.0 34.0 - 46.6 %   MCV 89 79 - 97 fL   MCH 29.3 26.6 - 33.0 pg   MCHC 32.9 31.5 - 35.7 g/dL   RDW 13.9 11.7 - 15.4 %   Platelets 225 150 - 450 x10E3/uL   Neutrophils 60 Not Estab. %   Lymphs 33 Not Estab. %   Monocytes 5 Not Estab. %   Eos 2 Not Estab. %   Basos 0 Not Estab. %   Neutrophils Absolute 5.0 1.4 - 7.0 x10E3/uL   Lymphocytes Absolute 2.7 0.7 - 3.1 x10E3/uL   Monocytes Absolute 0.5 0.1 - 0.9 x10E3/uL   EOS (ABSOLUTE) 0.1 0.0 - 0.4 x10E3/uL   Basophils Absolute 0.0 0.0 - 0.2 x10E3/uL   Immature Granulocytes 0 Not Estab. %   Immature Grans (Abs) 0.0 0.0 - 0.1 x10E3/uL  Comprehensive metabolic panel  Result Value Ref Range   Glucose 81 65 - 99 mg/dL   BUN 14 6 - 24 mg/dL   Creatinine, Ser  0.90 0.57 - 1.00 mg/dL   GFR calc non Af Amer 71 >59 mL/min/1.73   GFR calc Af Amer 82 >59 mL/min/1.73   BUN/Creatinine Ratio 16 9 - 23   Sodium 140 134 - 144 mmol/L   Potassium 4.8 3.5 - 5.2 mmol/L   Chloride 99 96 - 106 mmol/L   CO2 25 20 - 29 mmol/L   Calcium 10.1 8.7 - 10.2 mg/dL   Total Protein 7.2 6.0 - 8.5 g/dL   Albumin 4.8 3.8 - 4.9 g/dL   Globulin, Total 2.4 1.5 - 4.5 g/dL   Albumin/Globulin Ratio 2.0 1.2 - 2.2   Bilirubin Total 0.4 0.0 - 1.2 mg/dL   Alkaline Phosphatase 99 48 - 121 IU/L   AST 19 0 - 40 IU/L   ALT 21 0 - 32 IU/L  Lipid Panel w/o Chol/HDL Ratio  Result Value Ref Range   Cholesterol, Total 247 (H) 100 - 199 mg/dL   Triglycerides 230 (H) 0 - 149 mg/dL   HDL 41 >39 mg/dL   VLDL Cholesterol Cal 43 (H) 5 - 40 mg/dL   LDL Chol Calc (NIH) 163 (H) 0 - 99 mg/dL  TSH  Result Value Ref Range   TSH 2.390 0.450 - 4.500 uIU/mL      Assessment & Plan:   Problem List Items Addressed This Visit       Cardiovascular and Mediastinum   Hypertension - Primary    Chronic, stable with BP below goal in office today on recheck.  Continue Lisinopril 20 MG daily, HCTZ stopped last visit due to low NA+.  Recommend she monitor BP at home at least 3 mornings a week and document + focus on DASH diet.  LABS: CMP, CBC, TSH, Lipid.  Return in 6 months for follow-up.       Relevant Orders   Comprehensive metabolic panel   CBC with Differential/Platelet   Lipid Panel w/o Chol/HDL Ratio   TSH     Other   BMI 25.0-25.9,adult    Recommended eating smaller high protein, low fat meals more frequently and exercising 30 mins a day 5 times a week with a goal of 10-15lb weight loss in the next 3 months. Patient voiced their understanding and motivation to adhere to these recommendations.        Other Visit Diagnoses     Encounter for screening for HIV  HIV screening on labs today per guideline recommendation for one time screening, discussed with patient.   Relevant Orders    HIV Antibody (routine testing w rflx)   Need for hepatitis C screening test       Hep C screening on labs today per guideline recommendation for one time screening, discussed with patient.   Relevant Orders   Hepatitis C antibody   Encounter for screening mammogram for malignant neoplasm of breast       Mammogram ordered.   Relevant Orders   MM 3D SCREEN BREAST BILATERAL   Colon cancer screening       GI referral placed.   Relevant Orders   Ambulatory referral to Gastroenterology   Annual physical exam       Annual physical today, labs obtained and health maintenance reviewed.        Follow up plan: Return in about 6 months (around 02/03/2021) for HTN.   LABORATORY TESTING:  - Pap smear: up to date -- needs repeat but wishes to hold until next year  IMMUNIZATIONS:   - Tdap: Tetanus vaccination status reviewed: refused. - Influenza: Up to date - Pneumovax: Not applicable - Prevnar: Not applicable - HPV: Not applicable - Zostavax vaccine: Refused  SCREENING: -Mammogram: Up to date  - Colonoscopy: Ordered today  - Bone Density: Not applicable  -Hearing Test: Not applicable  -Spirometry: Not applicable   PATIENT COUNSELING:   Advised to take 1 mg of folate supplement per day if capable of pregnancy.   Sexuality: Discussed sexually transmitted diseases, partner selection, use of condoms, avoidance of unintended pregnancy  and contraceptive alternatives.   Advised to avoid cigarette smoking.  I discussed with the patient that most people either abstain from alcohol or drink within safe limits (<=14/week and <=4 drinks/occasion for males, <=7/weeks and <= 3 drinks/occasion for females) and that the risk for alcohol disorders and other health effects rises proportionally with the number of drinks per week and how often a drinker exceeds daily limits.  Discussed cessation/primary prevention of drug use and availability of treatment for abuse.   Diet: Encouraged to adjust  caloric intake to maintain  or achieve ideal body weight, to reduce intake of dietary saturated fat and total fat, to limit sodium intake by avoiding high sodium foods and not adding table salt, and to maintain adequate dietary potassium and calcium preferably from fresh fruits, vegetables, and low-fat dairy products.    Stressed the importance of regular exercise  Injury prevention: Discussed safety belts, safety helmets, smoke detector, smoking near bedding or upholstery.   Dental health: Discussed importance of regular tooth brushing, flossing, and dental visits.    NEXT PREVENTATIVE PHYSICAL DUE IN 1 YEAR. Return in about 6 months (around 02/03/2021) for HTN.

## 2020-08-04 NOTE — Patient Instructions (Signed)
[facility]  Address: [address], [place], [place] [address]  Phone: [phone]  Mammogram A mammogram is an X-ray of the breasts. This is done to check for changes that are not normal. This test can look for changes that may be caused by breast cancer or other problems. Mammograms are regularly done on women beginning at age 59. A man may have a mammogram if he has a lump or swelling in his breast. Tell a doctor: About any allergies you have. If you have breast implants. If you have had breast disease, biopsy, or surgery. If you have a family history of breast cancer. If you are breastfeeding. Whether you are pregnant or may be pregnant. What are the risks? Generally, this is a safe procedure. But problems may occur, including: Being exposed to radiation. Radiation levels are very low with this test. The need for more tests. The results were not read properly. Trouble finding breast cancer in women with dense breasts. What happens before the test? Have this test done about 1-2 weeks after your menstrual period. This is often when your breasts are the least tender. If you are visiting a new doctor or clinic, have any past mammogram images sent to your new doctor's office. Wash your breasts and under your arms on the day of the test. Do not use deodorants, perfumes, lotions, or powders on the day of the test. Take off any jewelry from your neck. Wear clothes that you can change into and out of easily. What happens during the test?  You will take off your clothes from the waist up. You will put on a gown. You will stand in front of the X-ray machine. Each breast will be placed between two plastic or glass plates. The plates will press down on your breast for a few seconds. Try to relax. This does not cause any harm to your breasts. It may not feel comfortable, but it will be very brief. X-rays will be taken from different angles of each  breast. The procedure may vary among doctors and hospitals. What can I expect after the test? The mammogram will be read by a specialist (radiologist). You may need to do parts of the test again. This depends on the quality of the images. You may go back to your normal activities. It is up to you to get the results of your test. Ask how to get your results when they are ready. Summary A mammogram is an X-ray of the breasts. It looks for changes that may be caused by breast cancer or other problems. A man may have this test if he has a lump or swelling in his breast. Before the test, tell your doctor about any breast problems that you have had in the past. Have this test done about 1-2 weeks after your menstrual period. Ask when your test results will be ready. Make sure you get your test results. This information is not intended to replace advice given to you by your health care provider. Make sure you discuss any questions you have with your health care provider. Document Revised: 11/25/2019 Document Reviewed: 11/25/2019 Elsevier Patient Education  2022 Elsevier Inc.   

## 2020-08-05 ENCOUNTER — Ambulatory Visit: Payer: Self-pay | Admitting: *Deleted

## 2020-08-05 LAB — COMPREHENSIVE METABOLIC PANEL
ALT: 18 IU/L (ref 0–32)
AST: 14 IU/L (ref 0–40)
Albumin/Globulin Ratio: 2.2 (ref 1.2–2.2)
Albumin: 4.9 g/dL (ref 3.8–4.9)
Alkaline Phosphatase: 105 IU/L (ref 44–121)
BUN/Creatinine Ratio: 21 (ref 9–23)
BUN: 16 mg/dL (ref 6–24)
Bilirubin Total: 0.4 mg/dL (ref 0.0–1.2)
CO2: 25 mmol/L (ref 20–29)
Calcium: 9.9 mg/dL (ref 8.7–10.2)
Chloride: 100 mmol/L (ref 96–106)
Creatinine, Ser: 0.77 mg/dL (ref 0.57–1.00)
Globulin, Total: 2.2 g/dL (ref 1.5–4.5)
Glucose: 97 mg/dL (ref 65–99)
Potassium: 4 mmol/L (ref 3.5–5.2)
Sodium: 139 mmol/L (ref 134–144)
Total Protein: 7.1 g/dL (ref 6.0–8.5)
eGFR: 89 mL/min/{1.73_m2} (ref >59)

## 2020-08-05 LAB — CBC WITH DIFFERENTIAL/PLATELET
Basophils Absolute: 0.1 10*3/uL (ref 0.0–0.2)
Basos: 1 %
EOS (ABSOLUTE): 0.2 10*3/uL (ref 0.0–0.4)
Eos: 2 %
Hematocrit: 41.6 % (ref 34.0–46.6)
Hemoglobin: 14 g/dL (ref 11.1–15.9)
Immature Grans (Abs): 0 10*3/uL (ref 0.0–0.1)
Immature Granulocytes: 0 %
Lymphocytes Absolute: 2.4 10*3/uL (ref 0.7–3.1)
Lymphs: 36 %
MCH: 29.9 pg (ref 26.6–33.0)
MCHC: 33.7 g/dL (ref 31.5–35.7)
MCV: 89 fL (ref 79–97)
Monocytes Absolute: 0.4 10*3/uL (ref 0.1–0.9)
Monocytes: 6 %
Neutrophils Absolute: 3.5 10*3/uL (ref 1.4–7.0)
Neutrophils: 55 %
Platelets: 219 10*3/uL (ref 150–450)
RBC: 4.68 x10E6/uL (ref 3.77–5.28)
RDW: 14.5 % (ref 11.7–15.4)
WBC: 6.5 10*3/uL (ref 3.4–10.8)

## 2020-08-05 LAB — LIPID PANEL W/O CHOL/HDL RATIO
Cholesterol, Total: 241 mg/dL — ABNORMAL HIGH (ref 100–199)
HDL: 47 mg/dL (ref >39)
LDL Chol Calc (NIH): 161 mg/dL — ABNORMAL HIGH (ref 0–99)
Triglycerides: 180 mg/dL — ABNORMAL HIGH (ref 0–149)
VLDL Cholesterol Cal: 33 mg/dL (ref 5–40)

## 2020-08-05 LAB — HEPATITIS C ANTIBODY: Hep C Virus Ab: <0.1 s/co ratio (ref 0.0–0.9)

## 2020-08-05 LAB — HIV ANTIBODY (ROUTINE TESTING W REFLEX): HIV Screen 4th Generation wRfx: NONREACTIVE

## 2020-08-05 LAB — TSH: TSH: 2.95 u[IU]/mL (ref 0.450–4.500)

## 2020-08-05 NOTE — Progress Notes (Signed)
Contacted via MyChart The 10-year ASCVD risk score Mikey Bussing DC Jr., et al., 2013) is: 5.7%   Values used to calculate the score:     Age: 60 years     Sex: Female     Is Non-Hispanic African American: No     Diabetic: No     Tobacco smoker: No     Systolic Blood Pressure: 950 mmHg     Is BP treated: Yes     HDL Cholesterol: 47 mg/dL     Total Cholesterol: 241 mg/dL  Good morning Alicia Copeland your labs have returned and overall they look fantastic with exception of cholesterol levels.  Your cholesterol is still high, but continued recommendations to make lifestyle changes. Your LDL is above normal. The LDL is the bad cholesterol. Over time and in combination with inflammation and other factors, this contributes to plaque which in turn may lead to stroke and/or heart attack down the road. Sometimes high LDL is primarily genetic, and people might be eating all the right foods but still have high numbers. Other times, there is room for improvement in one's diet and eating healthier can bring this number down and potentially reduce one's risk of heart attack and/or stroke.   To reduce your LDL, Remember - more fruits and vegetables, more fish, and limit red meat and dairy products. More soy, nuts, beans, barley, lentils, oats and plant sterol ester enriched margarine instead of butter. I also encourage eliminating sugar and processed food. Remember, shop on the outside of the grocery store and visit your [employer]. If you would like to talk with me about dietary changes plus or minus medications for your cholesterol, please let me know. We should recheck your cholesterol in 6 months.  Any questions? Keep being awesome!!  Thank you for allowing me to participate in your care.  I appreciate you. Kindest regards, Mouhamadou Gittleman

## 2020-08-05 NOTE — Telephone Encounter (Addendum)
Calls with B/P reading last night of 134/82. Had slight HA after doctor's appointment yesterday. Denies CP/SOB/dizziness/weakness "but feels my pressure is higher than normal". Took wrist B/P after up and walking/talking, systolic 568. She had a much lower reading several times earlier today from 130's to 160.Asked the patient to wait until she has been relaxing for 20 minutes to retake if she needed.  Taking lisinopril as directed. Last 3 office visits b/p slightly elevated. Reported medication was changed in December 2021 she was taking  Lisinopril-hctz 20 12.5 mg. --Reviewed symptoms if occurred to call 911. Stated she understood. Routing to provider for advice regarding medication.  --This is not an acute encounter and patient is aware she may not receive a call until the am.  --She is concerned she may need the previous B/P medication.

## 2020-08-05 NOTE — Telephone Encounter (Signed)
B/P  Reason for Disposition  Systolic BP  >= 865 OR Diastolic >= 784  Answer Assessment - Initial Assessment Questions 1. BLOOD PRESSURE: "What is the blood pressure?" "Did you take at least two measurements 5 minutes apart?"     Today 696-295'M systolic 2. ONSET: "When did you take your blood pressure?"     Now  3. HOW: "How did you obtain the blood pressure?" (e.g., visiting nurse, automatic home BP monitor)     Wrist cuff at home 4. HISTORY: "Do you have a history of high blood pressure?"     yes 5. MEDICATIONS: "Are you taking any medications for blood pressure?" "Have you missed any doses recently?"     Taking lisinopril as directed 6. OTHER SYMPTOMS: "Do you have any symptoms?" (e.g., headache, chest pain, blurred vision, difficulty breathing, weakness)     Slight headache yesterday after appointment 7. PREGNANCY: "Is there any chance you are pregnant?" "When was your last menstrual period?"     na  Protocols used: Blood Pressure - High-A-AH

## 2020-08-06 NOTE — Telephone Encounter (Signed)
Spoke with patient and made her aware of Jolene's recommendations and patient states she will monitor her blood pressure reading and give our office a call back if she has any questions. Patient states she woke up with blood pressure reading was 177.

## 2021-01-30 DIAGNOSIS — E7800 Pure hypercholesterolemia, unspecified: Secondary | ICD-10-CM | POA: Insufficient documentation

## 2021-02-03 ENCOUNTER — Other Ambulatory Visit: Payer: Self-pay

## 2021-02-03 ENCOUNTER — Ambulatory Visit: Payer: BC Managed Care – PPO | Admitting: Nurse Practitioner

## 2021-02-03 ENCOUNTER — Encounter: Payer: Self-pay | Admitting: Nurse Practitioner

## 2021-02-03 VITALS — BP 132/86 | HR 73 | Temp 99.3°F | Resp 18 | Ht 64.0 in | Wt 155.6 lb

## 2021-02-03 DIAGNOSIS — Z6826 Body mass index (BMI) 26.0-26.9, adult: Secondary | ICD-10-CM | POA: Diagnosis not present

## 2021-02-03 DIAGNOSIS — I10 Essential (primary) hypertension: Secondary | ICD-10-CM | POA: Diagnosis not present

## 2021-02-03 DIAGNOSIS — E7800 Pure hypercholesterolemia, unspecified: Secondary | ICD-10-CM

## 2021-02-03 DIAGNOSIS — M170 Bilateral primary osteoarthritis of knee: Secondary | ICD-10-CM | POA: Diagnosis not present

## 2021-02-03 MED ORDER — DICLOFENAC SODIUM 1 % EX GEL: 2.0000 g | Freq: Four times a day (QID) | CUTANEOUS | 4 refills | Status: AC

## 2021-02-03 NOTE — Assessment & Plan Note (Signed)
Noted on past labs with LDL 161 and ASCVD 5.5%.  No history of MI or CVA.  At this time recheck lipid panel and continue diet focus.

## 2021-02-03 NOTE — Assessment & Plan Note (Signed)
Recommended eating smaller high protein, low fat meals more frequently and exercising 30 mins a day 5 times a week with a goal of 10-15lb weight loss in the next 3 months. Patient voiced their understanding and motivation to adhere to these recommendations.  

## 2021-02-03 NOTE — Progress Notes (Addendum)
BP 132/86 (BP Location: Left Arm, Patient Position: Sitting, Cuff Size: Normal)    Pulse 73    Temp 99.3 F (37.4 C)    Resp 18    Ht $R'5\' 4"'Ks$  (1.626 m)    Wt 155 lb 9.6 oz (70.6 kg)    LMP 10/07/2016 (Approximate)    SpO2 96%    BMI 26.71 kg/m    Subjective:    Patient ID: Alicia Copeland, female    DOB: 09/23/1960, 60 y.o.   MRN: [id number]  HPI: Alicia Copeland is a 60 y.o. female  Chief Complaint  Patient presents with   Hypertension    Patient is to follow up on Hypertension. Patient states her blood pressure readings have been well.    Arthritis    Patient states she would like to discuss Arthritis with provider at today's visit.    Medication Refill    Patient is requesting a new prescription for Voltaren gel to help.    HYPERTENSION Continues on Lisinopril 20 MG.   Hypertension status: stable  Satisfied with current treatment? yes Duration of hypertension: chronic BP monitoring frequency: not checking BP range:  BP medication side effects:  no Medication compliance: good compliance Aspirin: no Recurrent headaches: no Visual changes: no Palpitations: no Dyspnea: no Chest pain: no Lower extremity edema: no Dizzy/lightheaded: no The 10-year ASCVD risk score (Arnett DK, et al., 2019) is: 5.5%   Values used to calculate the score:     Age: 29 years     Sex: Female     Is Non-Hispanic African American: No     Diabetic: No     Tobacco smoker: No     Systolic Blood Pressure: 371 mmHg     Is BP treated: Yes     HDL Cholesterol: 47 mg/dL     Total Cholesterol: 241 mg/dL  ARTHRALGIAS (KNEES) Has been present for several years.  To right knee had imaging 02/21/2018 = degenerative changes noted and small effusion.  Now discomfort is starting to left knee.  Reports both of them are inflamed.  When sits down on toilet she can hear her knees crackle.  Sometimes the soreness gets so intense she can not take another step.  She is not interested in physical therapy at this time  and injection at this time, but may consider in future.  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 Duration:  years -- 5-6 years Pain: yes Symmetric: yes  8/10 Quality: sharp, dull, aching, and throbbing Frequency: intermittent Context:  fluctuating Decreased function/range of motion: yes Erythema: none Swelling: yes Heat or warmth: none Morning stiffness: yes -- lasts about 30 minutes or less Aggravating factors: movement Alleviating factors: Voltaren Relief with NSAIDs?: No NSAIDs Taken Treatments attempted:   Voltaren and APAP  Involved Joints:     Hands: none    Wrists: none    Elbows: none    Shoulders: none    Back: no     Hips: none    Knees: yes bilateral    Ankles: none    Feet: none   Relevant past medical, surgical, family and social history reviewed and updated as indicated. Interim medical history since our last visit reviewed. Allergies and medications reviewed and updated.  Review of Systems  Constitutional:  Negative for activity change, appetite change, diaphoresis, fatigue and fever.  Respiratory:  Negative for cough, chest tightness and shortness of breath.   Cardiovascular:  Negative for  chest pain, palpitations and leg swelling.  Gastrointestinal: Negative.   Musculoskeletal:  Positive for arthralgias.  Neurological: Negative.   Psychiatric/Behavioral: Negative.     Per HPI unless specifically indicated above     Objective:    BP 132/86 (BP Location: Left Arm, Patient Position: Sitting, Cuff Size: Normal)    Pulse 73    Temp 99.3 F (37.4 C)    Resp 18    Ht $R'5\' 4"'Wu$  (1.626 m)    Wt 155 lb 9.6 oz (70.6 kg)    LMP 10/07/2016 (Approximate)    SpO2 96%    BMI 26.71 kg/m   Wt Readings from Last 3 Encounters:  02/03/21 155 lb 9.6 oz (70.6 kg)  08/04/20 150 lb (68 kg)  06/09/20 146 lb 12.8 oz (66.6 kg)    Physical Exam Vitals and nursing note reviewed.  Constitutional:      General: She is awake. She is not in acute  distress.    Appearance: She is well-developed and well-groomed. She is not ill-appearing.  HENT:     Head: Normocephalic.     Right Ear: Hearing normal.     Left Ear: Hearing normal.  Eyes:     General: Lids are normal.        Right eye: No discharge.        Left eye: No discharge.     Conjunctiva/sclera: Conjunctivae normal.     Pupils: Pupils are equal, round, and reactive to light.  Neck:     Thyroid: No thyromegaly.     Vascular: No carotid bruit.  Cardiovascular:     Rate and Rhythm: Normal rate and regular rhythm.     Heart sounds: Normal heart sounds. No murmur heard.   No gallop.  Pulmonary:     Effort: Pulmonary effort is normal. No accessory muscle usage or respiratory distress.     Breath sounds: Normal breath sounds.  Abdominal:     General: Bowel sounds are normal.     Palpations: Abdomen is soft.  Musculoskeletal:     Cervical back: Normal range of motion and neck supple.     Right knee: Crepitus present. No swelling, erythema or bony tenderness. Normal range of motion. Tenderness present over the medial joint line.     Instability Tests: Anterior drawer test negative. Posterior drawer test negative.     Left knee: Crepitus present. No swelling, erythema or bony tenderness. Normal range of motion. Tenderness present over the medial joint line.     Instability Tests: Anterior drawer test negative. Posterior drawer test negative.     Right lower leg: No edema.     Left lower leg: No edema.  Skin:    General: Skin is warm and dry.  Neurological:     Mental Status: She is alert and oriented to person, place, and time.  Psychiatric:        Attention and Perception: Attention normal.        Mood and Affect: Mood normal.        Speech: Speech normal.        Behavior: Behavior normal. Behavior is cooperative.        Thought Content: Thought content normal.    Results for orders placed or performed in visit on 08/04/20  Hepatitis C antibody  Result Value Ref Range    Hep C Virus Ab <0.1 0.0 - 0.9 s/co ratio  HIV Antibody (routine testing w rflx)  Result Value Ref Range   HIV Screen 4th Generation  wRfx Non Reactive Non Reactive  Comprehensive metabolic panel  Result Value Ref Range   Glucose 97 65 - 99 mg/dL   BUN 16 6 - 24 mg/dL   Creatinine, Ser 0.77 0.57 - 1.00 mg/dL   eGFR 89 >59 mL/min/1.73   BUN/Creatinine Ratio 21 9 - 23   Sodium 139 134 - 144 mmol/L   Potassium 4.0 3.5 - 5.2 mmol/L   Chloride 100 96 - 106 mmol/L   CO2 25 20 - 29 mmol/L   Calcium 9.9 8.7 - 10.2 mg/dL   Total Protein 7.1 6.0 - 8.5 g/dL   Albumin 4.9 3.8 - 4.9 g/dL   Globulin, Total 2.2 1.5 - 4.5 g/dL   Albumin/Globulin Ratio 2.2 1.2 - 2.2   Bilirubin Total 0.4 0.0 - 1.2 mg/dL   Alkaline Phosphatase 105 44 - 121 IU/L   AST 14 0 - 40 IU/L   ALT 18 0 - 32 IU/L  CBC with Differential/Platelet  Result Value Ref Range   WBC 6.5 3.4 - 10.8 x10E3/uL   RBC 4.68 3.77 - 5.28 x10E6/uL   Hemoglobin 14.0 11.1 - 15.9 g/dL   Hematocrit 41.6 34.0 - 46.6 %   MCV 89 79 - 97 fL   MCH 29.9 26.6 - 33.0 pg   MCHC 33.7 31.5 - 35.7 g/dL   RDW 14.5 11.7 - 15.4 %   Platelets 219 150 - 450 x10E3/uL   Neutrophils 55 Not Estab. %   Lymphs 36 Not Estab. %   Monocytes 6 Not Estab. %   Eos 2 Not Estab. %   Basos 1 Not Estab. %   Neutrophils Absolute 3.5 1.4 - 7.0 x10E3/uL   Lymphocytes Absolute 2.4 0.7 - 3.1 x10E3/uL   Monocytes Absolute 0.4 0.1 - 0.9 x10E3/uL   EOS (ABSOLUTE) 0.2 0.0 - 0.4 x10E3/uL   Basophils Absolute 0.1 0.0 - 0.2 x10E3/uL   Immature Granulocytes 0 Not Estab. %   Immature Grans (Abs) 0.0 0.0 - 0.1 x10E3/uL  Lipid Panel w/o Chol/HDL Ratio  Result Value Ref Range   Cholesterol, Total 241 (H) 100 - 199 mg/dL   Triglycerides 180 (H) 0 - 149 mg/dL   HDL 47 >39 mg/dL   VLDL Cholesterol Cal 33 5 - 40 mg/dL   LDL Chol Calc (NIH) 161 (H) 0 - 99 mg/dL  TSH  Result Value Ref Range   TSH 2.950 0.450 - 4.500 uIU/mL      Assessment & Plan:   Problem List Items Addressed  This Visit       Cardiovascular and Mediastinum   Hypertension - Primary    Chronic, stable with BP below goal in office today on recheck.  Continue Lisinopril 20 MG daily, HCTZ stopped months back due to low NA+.  Recommend she monitor BP at home at least 3 mornings a week and document + focus on DASH diet.  LABS: CMP.  Return in 6 months for follow-up.        Musculoskeletal and Integument   Primary osteoarthritis of both knees    Ongoing issue with some worsening over past 2 years, repeat imaging including left knee.  Script for Voltaren gel sent, although discussed with patient this may not be covered and she may need to obtain OTC.  Recommend Tylenol as needed, max dose 3000 MG daily total.  Wear compression knee support and perform stretches at home.  Return in 4 weeks for f/u and may consider knee steroid injection, educated on risks and benefits of this.  Check labs today to r/o other underlying arthritic causes.      Relevant Orders   DG Knee Complete 4 Views Right   DG Knee Complete 4 Views Left   ANA w/Reflex if Positive   C-reactive protein   Sed Rate (ESR)     Other   BMI 26.0-26.9,adult    Recommended eating smaller high protein, low fat meals more frequently and exercising 30 mins a day 5 times a week with a goal of 10-15lb weight loss in the next 3 months. Patient voiced their understanding and motivation to adhere to these recommendations.       Elevated low density lipoprotein (LDL) cholesterol level    Noted on past labs with LDL 161 and ASCVD 5.5%.  No history of MI or CVA.  At this time recheck lipid panel and continue diet focus.      Relevant Orders   Comp Met (CMET)   Lipid Panel w/o Chol/HDL Ratio     Follow up plan: Return in about 4 weeks (around 03/03/2021) for Bilateral knee pain -- possible steroid injection.

## 2021-02-03 NOTE — Assessment & Plan Note (Signed)
Chronic, stable with BP below goal in office today on recheck.  Continue Lisinopril 20 MG daily, HCTZ stopped months back due to low NA+.  Recommend she monitor BP at home at least 3 mornings a week and document + focus on DASH diet.  LABS: CMP.  Return in 6 months for follow-up.

## 2021-02-03 NOTE — Patient Instructions (Addendum)
Please call to schedule your mammogram and/or bone density: [facility]  Address: [address], [place], [place] [address]  Phone: [phone]   Chronic Knee Pain, Adult Knee pain that lasts longer than 3 months is called chronic knee pain. You may have pain in one or both knees. Symptoms of chronic knee pain may also include swelling and stiffness. The most common cause is age-related wear and tear (osteoarthritis) of your knee joint. Many conditions can cause chronic knee pain. Treatment depends on the cause. The main treatments are physical therapy and weight loss. It may also be treated with medicines, injections, a knee sleeve or brace, and by using crutches. Rest, ice, pressure (compression), and elevation, also known as RICE therapy, may also be recommended. Follow these instructions at home: If you have a knee sleeve or brace:  Wear the knee sleeve or brace as told by your doctor. Take it off only as told by your doctor. Loosen it if your toes: Tingle. Become numb. Turn cold and blue. Keep it clean. If the sleeve or brace is not waterproof: Do not let it get wet. Ask your doctor if you may take it off when you take a bath or shower. If not, cover it with a watertight covering. Managing pain, stiffness, and swelling   If told, put heat on your knee. Do this as often as told by your doctor. Use the heat source that your doctor recommends, such as a moist heat pack or a heating pad. If you have a removable knee sleeve or brace, take it off as told by your doctor. Place a towel between your skin and the heat source. Leave the heat on for 20-30 minutes. Take off the heat if your skin turns bright red. This is very important. If you cannot feel pain, heat, or cold, you have a greater risk of getting burned. If told, put ice on your knee. To do this: If you have a removable knee sleeve or brace, take it off as told by your doctor. Put ice in a  plastic bag. Place a towel between your skin and the bag. Leave the ice on for 20 minutes, 2-3 times a day. Take off the ice if your skin turns bright red. This is very important. If you cannot feel pain, heat, or cold, you have a greater risk of damage to the area. Move your toes often. Raise the injured area above the level of your heart while you are sitting or lying down. Activity Avoid activities where both feet leave the ground at the same time (high-impact activities). Examples are running, jumping rope, and doing jumping jacks. Follow the exercise plan that your doctor makes for you. Your doctor may suggest that you: Avoid activities that make knee pain worse. You may need to change the exercises that you do, the sports that you participate in, or your job duties. Wear shoes with cushioned soles. Avoid sports that require running and sudden changes in direction. Do exercises or physical therapy. This is planned to match your needs and your abilities. Do exercises that increase your balance and strength, such as tai chi and yoga. Do not use your injured knee to support your body weight until your doctor says that you can. Use crutches as told by your doctor. Return to your normal activities when your doctor says that it is safe. General instructions Take over-the-counter and prescription medicines only as told by your doctor. If you are overweight,  work with your doctor and a Publishing rights manager (dietitian) to set goals to lose weight. Being overweight can make your knee hurt more. Do not smoke or use any products that contain nicotine or tobacco. If you need help quitting, ask your doctor. Keep all follow-up visits. Contact a doctor if: You have knee pain that is not getting better or gets worse. You are not able to do your exercises due to knee pain. Get help right away if: Your knee swells and the swelling gets worse. You cannot move your knee. You have very bad knee pain. Summary Knee  pain that lasts more than 3 months is called chronic knee pain. The main treatments for chronic knee pain are physical therapy and weight loss. You may also need to take medicines, wear a knee sleeve or brace, use crutches, and put ice or heat on your knee. Lose weight if you are overweight. Work with your doctor and a food expert (dietitian) to help you set goals to lose weight. Being overweight can make your knee hurt more. Follow the exercise plan that your doctor makes for you. This information is not intended to replace advice given to you by your health care provider. Make sure you discuss any questions you have with your health care provider. Document Revised: 07/10/2019 Document Reviewed: 07/10/2019 Elsevier Patient Education  2022 [employer].

## 2021-02-03 NOTE — Assessment & Plan Note (Addendum)
Ongoing issue with some worsening over past 2 years, repeat imaging including left knee.  Script for Voltaren gel sent, although discussed with patient this may not be covered and she may need to obtain OTC.  Recommend Tylenol as needed, max dose 3000 MG daily total.  Wear compression knee support and perform stretches at home.  Return in 4 weeks for f/u and may consider knee steroid injection, educated on risks and benefits of this.  Check labs today to r/o other underlying arthritic causes.

## 2021-02-04 ENCOUNTER — Encounter: Payer: Self-pay | Admitting: Nurse Practitioner

## 2021-02-04 LAB — COMPREHENSIVE METABOLIC PANEL
ALT: 18 IU/L (ref 0–32)
AST: 17 IU/L (ref 0–40)
Albumin/Globulin Ratio: 1.8 (ref 1.2–2.2)
Albumin: 4.3 g/dL (ref 3.8–4.9)
Alkaline Phosphatase: 108 IU/L (ref 44–121)
BUN/Creatinine Ratio: 18 (ref 9–23)
BUN: 12 mg/dL (ref 6–24)
Bilirubin Total: 0.3 mg/dL (ref 0.0–1.2)
CO2: 26 mmol/L (ref 20–29)
Calcium: 9.5 mg/dL (ref 8.7–10.2)
Chloride: 103 mmol/L (ref 96–106)
Creatinine, Ser: 0.67 mg/dL (ref 0.57–1.00)
Globulin, Total: 2.4 g/dL (ref 1.5–4.5)
Glucose: 94 mg/dL (ref 70–99)
Potassium: 4.5 mmol/L (ref 3.5–5.2)
Sodium: 141 mmol/L (ref 134–144)
Total Protein: 6.7 g/dL (ref 6.0–8.5)
eGFR: 101 mL/min/{1.73_m2} (ref >59)

## 2021-02-04 LAB — LIPID PANEL W/O CHOL/HDL RATIO
Cholesterol, Total: 231 mg/dL — ABNORMAL HIGH (ref 100–199)
HDL: 43 mg/dL (ref >39)
LDL Chol Calc (NIH): 163 mg/dL — ABNORMAL HIGH (ref 0–99)
Triglycerides: 138 mg/dL (ref 0–149)
VLDL Cholesterol Cal: 25 mg/dL (ref 5–40)

## 2021-02-04 LAB — C-REACTIVE PROTEIN: CRP: 38 mg/L — ABNORMAL HIGH (ref 0–10)

## 2021-02-04 LAB — SEDIMENTATION RATE: Sed Rate: 13 mm/hr (ref 0–40)

## 2021-02-04 LAB — ANA W/REFLEX IF POSITIVE: Anti Nuclear Antibody (ANA): NEGATIVE

## 2021-02-04 NOTE — Progress Notes (Signed)
Contacted via MyChart The 10-year ASCVD risk score (Arnett DK, et al., 2019) is: 5.7%   Values used to calculate the score:     Age: 60 years     Sex: Female     Is Non-Hispanic African American: No     Diabetic: No     Tobacco smoker: No     Systolic Blood Pressure: 227 mmHg     Is BP treated: Yes     HDL Cholesterol: 43 mg/dL     Total Cholesterol: 231 mg/dL   Good evening Alicia Copeland, your labs have returned: - As I mentioned ANA is negative and ESR is normal.  CRP was mildly elevated, which could be related to your knees. - Kidney function, creatinine and eGFR, is normal + liver function, AST and ALT, is normal. - Your cholesterol is still high, but continued recommendations to make lifestyle changes. Your LDL is above normal. The LDL is the bad cholesterol. Over time and in combination with inflammation and other factors, this contributes to plaque which in turn may lead to stroke and/or heart attack down the road. Sometimes high LDL is primarily genetic, and people might be eating all the right foods but still have high numbers. Other times, there is room for improvement in one's diet and eating healthier can bring this number down and potentially reduce one's risk of heart attack and/or stroke.   To reduce your LDL, Remember - more fruits and vegetables, more fish, and limit red meat and dairy products. More soy, nuts, beans, barley, lentils, oats and plant sterol ester enriched margarine instead of butter. I also encourage eliminating sugar and processed food. Remember, shop on the outside of the grocery store and visit your [employer]. If you would like to talk with me about dietary changes for your cholesterol, please let me know. We should recheck your cholesterol in 6 months.  Any questions? Keep being amazing!!  Thank you for allowing me to participate in your care.  I appreciate you. Kindest regards, Britzy Graul

## 2021-02-15 ENCOUNTER — Other Ambulatory Visit: Payer: Self-pay | Admitting: Nurse Practitioner

## 2021-02-15 NOTE — Telephone Encounter (Signed)
Requested Prescriptions  Pending Prescriptions Disp Refills   lisinopril (ZESTRIL) 20 MG tablet [Pharmacy Med Name: LISINOPRIL 20 MG TABLET] 90 tablet 1    Sig: TAKE 1 TABLET BY MOUTH EVERY DAY     Cardiovascular:  ACE Inhibitors Passed - 02/15/2021  1:32 AM      Passed - Cr in normal range and within 180 days    Creatinine, Ser  Date Value Ref Range Status  02/03/2021 0.67 0.57 - 1.00 mg/dL Final         Passed - K in normal range and within 180 days    Potassium  Date Value Ref Range Status  02/03/2021 4.5 3.5 - 5.2 mmol/L Final         Passed - Patient is not pregnant      Passed - Last BP in normal range    BP Readings from Last 1 Encounters:  02/03/21 132/86         Passed - Valid encounter within last 6 months    Recent Outpatient Visits          1 week ago Primary hypertension   [place], Barbaraann Faster, NP   6 months ago Primary hypertension   [place], [place] T, NP   8 months ago Cellulitis of face   [place], Lauren A, NP   8 months ago Cellulitis of face   [place], Barbaraann Faster, NP   1 year ago Primary hypertension   [place], Barbaraann Faster, NP      Future Appointments            In 3 weeks [place], Barbaraann Faster, NP [employer]

## 2021-03-08 ENCOUNTER — Ambulatory Visit: Payer: BC Managed Care – PPO | Admitting: Nurse Practitioner

## 2021-03-08 ENCOUNTER — Other Ambulatory Visit: Payer: Self-pay

## 2021-03-08 ENCOUNTER — Encounter: Payer: Self-pay | Admitting: Nurse Practitioner

## 2021-03-08 DIAGNOSIS — M170 Bilateral primary osteoarthritis of knee: Secondary | ICD-10-CM

## 2021-03-08 NOTE — Assessment & Plan Note (Signed)
Improving at this time with Voltaren application.  Recommend continue Tylenol as needed, max dose 3000 MG daily total.  Wear compression knee support and perform stretches at home.  Perform PT stretches at home and showed her various sites to obtain instructions on how to perform at home.  Will hold off on imaging and injections at this time and reassess at physical in June.

## 2021-03-08 NOTE — Progress Notes (Signed)
BP 127/79 (BP Location: Left Arm, Cuff Size: Normal)    Pulse 78    Temp 98.7 F (37.1 C) (Oral)    Ht _0  (1.626 m)    Wt 153 lb 12.8 oz (69.8 kg)    LMP 10/07/2016 (Approximate)    SpO2 97%    BMI 26.40 kg/m    Subjective:    Patient ID: Alicia Copeland, female    DOB: 08-08-1960, 61 y.o.   MRN: [id number]  HPI: Alicia Copeland is a 61 y.o. female  Chief Complaint  Patient presents with   Knee Pain    Patient states she has started to take the over the counter Voltaren and states it has helped a lot. Patient states she think the Voltaren has helped with the swelling. Patient states she still has stiffness in her right knee, but she does not feel anything in left knee.    ARTHRALGIAS (KNEES) Follow-up for knee pain, which she reports is improving with Voltaren gel.  Has been present for several years.  She has been subbing and on feet more.  To right knee had imaging 02/21/2018 = degenerative changes noted and small effusion.  Still has some stiffness and soreness which is manageable at this time.  She did not go for ordered imaging, wishes to hold off at this time.  She is not interested in physical therapy at this time and injection at this time, but may consider in future.  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 Duration: years -- 5-6 years Pain: yes, but improving Symmetric: left knee 0/10 and right knee 2/10 Quality: sharp, dull, aching, and throbbing Frequency: intermittent, but improving Context:  fluctuating Decreased function/range of motion: yes, improving Erythema: none Swelling: none Heat or warmth: none Morning stiffness: yes -- lasts about 30 minutes or less Aggravating factors: movement Alleviating factors: Voltaren Relief with NSAIDs?: No NSAIDs Taken Treatments attempted:   Voltaren and APAP  Involved Joints:     Hands: none    Wrists: none    Elbows: none    Shoulders: none    Back: no     Hips: none    Knees: yes  bilateral    Ankles: none    Feet: none  Relevant past medical, surgical, family and social history reviewed and updated as indicated. Interim medical history since our last visit reviewed. Allergies and medications reviewed and updated.  Review of Systems  Constitutional:  Negative for activity change, appetite change, diaphoresis, fatigue and fever.  Respiratory:  Negative for cough, chest tightness and shortness of breath.   Cardiovascular:  Negative for chest pain, palpitations and leg swelling.  Gastrointestinal: Negative.   Musculoskeletal:  Positive for arthralgias.  Neurological: Negative.   Psychiatric/Behavioral: Negative.     Per HPI unless specifically indicated above     Objective:    BP 127/79 (BP Location: Left Arm, Cuff Size: Normal)    Pulse 78    Temp 98.7 F (37.1 C) (Oral)    Ht _1  (1.626 m)    Wt 153 lb 12.8 oz (69.8 kg)    LMP 10/07/2016 (Approximate)    SpO2 97%    BMI 26.40 kg/m   Wt Readings from Last 3 Encounters:  03/08/21 153 lb 12.8 oz (69.8 kg)  02/03/21 155 lb 9.6 oz (70.6 kg)  08/04/20 150 lb (68 kg)    Physical Exam Vitals and nursing note reviewed.  Constitutional:  General: She is awake. She is not in acute distress.    Appearance: She is well-developed and well-groomed. She is not ill-appearing.  HENT:     Head: Normocephalic.     Right Ear: Hearing normal.     Left Ear: Hearing normal.  Eyes:     General: Lids are normal.        Right eye: No discharge.        Left eye: No discharge.     Conjunctiva/sclera: Conjunctivae normal.     Pupils: Pupils are equal, round, and reactive to light.  Neck:     Thyroid: No thyromegaly.     Vascular: No carotid bruit.  Cardiovascular:     Rate and Rhythm: Normal rate and regular rhythm.     Heart sounds: Normal heart sounds. No murmur heard.   No gallop.  Pulmonary:     Effort: Pulmonary effort is normal. No accessory muscle usage or respiratory distress.     Breath sounds: Normal  breath sounds.  Abdominal:     General: Bowel sounds are normal.     Palpations: Abdomen is soft.  Musculoskeletal:     Cervical back: Normal range of motion and neck supple.     Right knee: Crepitus present. No swelling, erythema or bony tenderness. Normal range of motion. Tenderness present over the medial joint line.     Instability Tests: Anterior drawer test negative. Posterior drawer test negative.     Left knee: No swelling, erythema, bony tenderness or crepitus. Normal range of motion. No tenderness.     Instability Tests: Anterior drawer test negative. Posterior drawer test negative.     Right lower leg: No edema.     Left lower leg: No edema.  Skin:    General: Skin is warm and dry.  Neurological:     Mental Status: She is alert and oriented to person, place, and time.  Psychiatric:        Attention and Perception: Attention normal.        Mood and Affect: Mood normal.        Speech: Speech normal.        Behavior: Behavior normal. Behavior is cooperative.        Thought Content: Thought content normal.    Results for orders placed or performed in visit on 02/03/21  ANA w/Reflex if Positive  Result Value Ref Range   Anti Nuclear Antibody (ANA) Negative Negative  C-reactive protein  Result Value Ref Range   CRP 38 (H) 0 - 10 mg/L  Sed Rate (ESR)  Result Value Ref Range   Sed Rate 13 0 - 40 mm/hr  Comp Met (CMET)  Result Value Ref Range   Glucose 94 70 - 99 mg/dL   BUN 12 6 - 24 mg/dL   Creatinine, Ser 0.67 0.57 - 1.00 mg/dL   eGFR 101 >59 mL/min/1.73   BUN/Creatinine Ratio 18 9 - 23   Sodium 141 134 - 144 mmol/L   Potassium 4.5 3.5 - 5.2 mmol/L   Chloride 103 96 - 106 mmol/L   CO2 26 20 - 29 mmol/L   Calcium 9.5 8.7 - 10.2 mg/dL   Total Protein 6.7 6.0 - 8.5 g/dL   Albumin 4.3 3.8 - 4.9 g/dL   Globulin, Total 2.4 1.5 - 4.5 g/dL   Albumin/Globulin Ratio 1.8 1.2 - 2.2   Bilirubin Total 0.3 0.0 - 1.2 mg/dL   Alkaline Phosphatase 108 44 - 121 IU/L   AST 17 0 -  40 IU/L   ALT 18 0 - 32 IU/L  Lipid Panel w/o Chol/HDL Ratio  Result Value Ref Range   Cholesterol, Total 231 (H) 100 - 199 mg/dL   Triglycerides 138 0 - 149 mg/dL   HDL 43 >39 mg/dL   VLDL Cholesterol Cal 25 5 - 40 mg/dL   LDL Chol Calc (NIH) 163 (H) 0 - 99 mg/dL      Assessment & Plan:   Problem List Items Addressed This Visit       Musculoskeletal and Integument   Primary osteoarthritis of both knees    Improving at this time with Voltaren application.  Recommend continue Tylenol as needed, max dose 3000 MG daily total.  Wear compression knee support and perform stretches at home.  Perform PT stretches at home and showed her various sites to obtain instructions on how to perform at home.  Will hold off on imaging and injections at this time and reassess at physical in June.        Follow up plan: Return in about 5 months (around 08/05/2021) for Annual physical.

## 2021-03-08 NOTE — Patient Instructions (Signed)
Acute Knee Pain, Adult °Many things can cause knee pain. Sometimes, knee pain is sudden (acute) and may be caused by damage, swelling, or irritation of the muscles and tissues that support your knee. °The pain often goes away on its own with time and rest. If the pain does not go away, tests may be done to find out what is causing the pain. °Follow these instructions at home: °If you have a knee sleeve or brace: ° °Wear the knee sleeve or brace as told by your doctor. Take it off only as told by your doctor. °Loosen it if your toes: °Tingle. °Become numb. °Turn cold and blue. °Keep it clean. °If the knee sleeve or brace is not waterproof: °Do not let it get wet. °Cover it with a watertight covering when you take a bath or shower. °Activity °Rest your knee. °Do not do things that cause pain or make pain worse. °Avoid activities where both feet leave the ground at the same time (high-impact activities). Examples are running, jumping rope, and doing jumping jacks. °Work with a physical therapist to make a safe exercise program, as told by your doctor. °Managing pain, stiffness, and swelling ° °If told, put ice on the knee. To do this: °If you have a removable knee sleeve or brace, take it off as told by your doctor. °Put ice in a plastic bag. °Place a towel between your skin and the bag. °Leave the ice on for 20 minutes, 2-3 times a day. °Take off the ice if your skin turns bright red. This is very important. If you cannot feel pain, heat, or cold, you have a greater risk of damage to the area. °If told, use an elastic bandage to put pressure (compression) on your injured knee. °Raise your knee above the level of your heart while you are sitting or lying down. °Sleep with a pillow under your knee. °General instructions °Take over-the-counter and prescription medicines only as told by your doctor. °Do not smoke or use any products that contain nicotine or tobacco. If you need help quitting, ask your doctor. °If you are  overweight, work with your doctor and a food expert (dietitian) to set goals to lose weight. Being overweight can make your knee hurt more. °Watch for any changes in your symptoms. °Keep all follow-up visits. °Contact a doctor if: °The knee pain does not stop. °The knee pain changes or gets worse. °You have a fever along with knee pain. °Your knee is red or feels warm when you touch it. °Your knee gives out or locks up. °Get help right away if: °Your knee swells, and the swelling gets worse. °You cannot move your knee. °You have very bad knee pain that does not get better with pain medicine. °Summary °Many things can cause knee pain. The pain often goes away on its own with time and rest. °Your doctor may do tests to find out the cause of the pain. °Watch for any changes in your symptoms. Relieve your pain with rest, medicines, light activity, and use of ice. °Get help right away if you cannot move your knee or your knee pain is very bad. °This information is not intended to replace advice given to you by your health care provider. Make sure you discuss any questions you have with your health care provider. °Document Revised: 07/10/2019 Document Reviewed: 07/10/2019 °Elsevier Patient Education © 2022 [employer] ° °

## 2021-03-09 ENCOUNTER — Telehealth: Payer: Self-pay | Admitting: Nurse Practitioner

## 2021-03-09 NOTE — Telephone Encounter (Signed)
Copied from [place] [id number]. Topic: General - Other >> Mar 09, 2021  1:24 PM Tessa Lerner A wrote: Reason for CRM: The patient would like to speak with a member of administrative staff regarding charges for a visit on 02/03/21  The patient is regularly charged a $30 copay but has been billed an additional $15   The patient would like to further address the charges and coding for the visit when possible   Please contact further when available

## 2021-05-06 NOTE — Telephone Encounter (Signed)
Spoke with patient and discussed insurance copay concerns. Patient thinks she is being charged additional $15 because Henrine Screws is an NP. Patient states that her insurance company does not find [facility] listed as a participating provider but does find Dr. Wynetta Emery. ?

## 2021-08-06 ENCOUNTER — Ambulatory Visit
Admission: RE | Admit: 2021-08-06 | Discharge: 2021-08-06 | Disposition: A | Discharge status: Home / Self Care | Payer: BC Managed Care – PPO | Source: Ambulatory Visit | Attending: Nurse Practitioner | Admitting: Nurse Practitioner

## 2021-08-06 ENCOUNTER — Ambulatory Visit (INDEPENDENT_AMBULATORY_CARE_PROVIDER_SITE_OTHER): Payer: BC Managed Care – PPO | Admitting: Nurse Practitioner

## 2021-08-06 ENCOUNTER — Other Ambulatory Visit: Payer: Self-pay | Admitting: Nurse Practitioner

## 2021-08-06 ENCOUNTER — Encounter: Payer: Self-pay | Admitting: Nurse Practitioner

## 2021-08-06 VITALS — BP 138/88 | HR 80 | Temp 98.0°F | Ht 65.6 in | Wt 155.4 lb

## 2021-08-06 DIAGNOSIS — Z6826 Body mass index (BMI) 26.0-26.9, adult: Secondary | ICD-10-CM | POA: Diagnosis not present

## 2021-08-06 DIAGNOSIS — E559 Vitamin D deficiency, unspecified: Secondary | ICD-10-CM

## 2021-08-06 DIAGNOSIS — Z0000 Encounter for general adult medical examination without abnormal findings: Secondary | ICD-10-CM | POA: Diagnosis not present

## 2021-08-06 DIAGNOSIS — Z1231 Encounter for screening mammogram for malignant neoplasm of breast: Secondary | ICD-10-CM | POA: Insufficient documentation

## 2021-08-06 DIAGNOSIS — E7800 Pure hypercholesterolemia, unspecified: Secondary | ICD-10-CM | POA: Diagnosis not present

## 2021-08-06 DIAGNOSIS — Z1211 Encounter for screening for malignant neoplasm of colon: Secondary | ICD-10-CM

## 2021-08-06 DIAGNOSIS — I10 Essential (primary) hypertension: Secondary | ICD-10-CM

## 2021-08-06 MED ORDER — LISINOPRIL 20 MG PO TABS: 20.0000 mg | ORAL_TABLET | Freq: Every day | ORAL | 4 refills | Status: DC

## 2021-08-06 NOTE — Assessment & Plan Note (Signed)
Noted on past labs with LDL <190 and ASCVD 6.8%.  No history of MI or CVA.  At this time recheck lipid panel and continue diet focus.

## 2021-08-06 NOTE — Progress Notes (Signed)
BP 138/88 (BP Location: Left Arm, Patient Position: Sitting, Cuff Size: Normal)   Pulse 80   Temp 98 F (36.7 C) (Oral)   Ht 5' 5.6" (1.666 m)   Wt 155 lb 6.4 oz (70.5 kg)   LMP 10/07/2016 (Approximate)   SpO2 98%   BMI 25.39 kg/m    Subjective:    Patient ID: Alicia Copeland, female    DOB: 05/10/1960, 61 y.o.   MRN: [id number]  HPI: Alicia Copeland is a 61 y.o. female presenting on 08/06/2021 for comprehensive medical examination. Current medical complaints include: none  She currently lives with:spouse Menopausal Symptoms: no  HYPERTENSION Continues on Lisinopril 20 MG.   Hypertension status: stable  Satisfied with current treatment? yes Duration of hypertension: chronic BP monitoring frequency: not checking BP range:  BP medication side effects:  no Medication compliance: good compliance Aspirin: no Recurrent headaches: no Visual changes: no Palpitations: no Dyspnea: no Chest pain: no Lower extremity edema: no Dizzy/lightheaded: no The 10-year ASCVD risk score (Arnett DK, et al., 2019) is: 6.8%   Values used to calculate the score:     Age: 70 years     Sex: Female     Is Non-Hispanic African American: No     Diabetic: No     Tobacco smoker: No     Systolic Blood Pressure: 599 mmHg     Is BP treated: Yes     HDL Cholesterol: 43 mg/dL     Total Cholesterol: 231 mg/dL   Depression Screen done today and results listed below:     08/06/2021    1:47 PM 02/03/2021   10:33 AM 08/04/2020   10:06 AM 02/04/2020    9:41 AM 07/29/2019    2:58 PM  Depression screen PHQ 2/9  Decreased Interest 0 0 0 0 0  Down, Depressed, Hopeless 0 0 0 0 0  PHQ - 2 Score 0 0 0 0 0  Altered sleeping 0 0     Tired, decreased energy 0 0     Change in appetite 0 0     Feeling bad or failure about yourself  0 0     Trouble concentrating 0 0     Moving slowly or fidgety/restless 0 0     Suicidal thoughts 0 0     PHQ-9 Score 0 0     Difficult doing work/chores Not difficult at all            06/30/2017    3:42 PM 01/08/2019    7:58 AM 07/29/2019    2:58 PM 02/04/2020    9:41 AM 08/06/2021    1:47 PM  Fall Risk  Falls in the past year? No 0 0 0 0  Was there an injury with Fall?  0 0  0  Fall Risk Category Calculator  0 0  0  Fall Risk Category  Low Low  Low  Patient Fall Risk Level  Low fall risk Low fall risk  Low fall risk  Patient at Risk for Falls Due to    No Fall Risks No Fall Risks  Fall risk Follow up  Falls evaluation completed Falls evaluation completed  Falls evaluation completed    Functional Status Survey: Is the patient deaf or have difficulty hearing?: No Does the patient have difficulty seeing, even when wearing glasses/contacts?: No Does the patient have difficulty concentrating, remembering, or making decisions?: No Does the patient have difficulty walking or climbing stairs?: No Does the patient have difficulty  dressing or bathing?: No Does the patient have difficulty doing errands alone such as visiting a doctor's office or shopping?: No    Past Medical History:  Past Medical History:  Diagnosis Date   Hx of breast biopsy 01/2010   right breast at 10:00 position,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   Hypertension 2009   Lump or mass in breast 2011   Special screening for malignant neoplasms, colon     Surgical History:  Past Surgical History:  Procedure Laterality Date   BREAST BIOPSY Right 2007   Fibroadenoma, core biopsy, ER/PR negative.done in [place] Right 03/11/2010   Reexcision right breast lesion at the 10:00 position with no evidence of residual flow in tumor   BREAST SURGERY Right 02/04/2010   Right breast re-excision, no evidence of recurrent phyllodes tumor   COLONOSCOPY  2012   Dr. Candace Cruise    Medications:  Current Outpatient Medications on File Prior to  Visit  Medication Sig   diclofenac Sodium (VOLTAREN) 1 % GEL Apply 2 g topically 4 (four) times daily.   No current facility-administered medications on file prior to visit.    Allergies:  Allergies  Allergen Reactions   Sulfa Antibiotics Hives and Other (See Comments)    Alters liver function. "breaks out in liver"    Social History:  Social History   Socioeconomic History   Marital status: Married    Spouse name: Not on file   Number of children: Not on file   Years of education: Not on file   Highest education level: Not on file  Occupational History   Not on file  Tobacco Use   Smoking status: Never   Smokeless tobacco: Never  Vaping Use   Vaping Use: Never used  Substance and Sexual Activity   Alcohol use: No   Drug use: No   Sexual activity: Yes    Birth control/protection: Post-menopausal  Other Topics Concern   Not on file  Social History Narrative   Not on file   Social Determinants of Health   Financial Resource Strain: Not on file  Food Insecurity: Not on file  Transportation Needs: Not on file  Physical Activity: Not on file  Stress: Not on file  Social Connections: Not on file  Intimate Partner Violence: Not on file   Social History   Tobacco Use  Smoking Status Never  Smokeless Tobacco Never   Social History   Substance and Sexual Activity  Alcohol Use No    Family History:  Family History  Problem Relation Age of Onset   Diabetes Mother    Breast cancer Mother    Colon cancer Mother        breast cancer, lymphoma, greater than age 7   Cancer Maternal Grandmother 36       colon cancer, greater than age 54   Cancer Paternal Grandmother        breast cancer, greater than age 84   Breast cancer Paternal Grandmother    Hypertension Father    Diabetes Father    Heart attack Father    Hypertension Brother    Heart attack Maternal Grandfather     Past medical history, surgical history, medications, allergies, family history and  social history reviewed with patient today and changes made to appropriate areas of the chart.   ROS All other ROS  negative except what is listed above and in the HPI.      Objective:    BP 138/88 (BP Location: Left Arm, Patient Position: Sitting, Cuff Size: Normal)   Pulse 80   Temp 98 F (36.7 C) (Oral)   Ht 5' 5.6" (1.666 m)   Wt 155 lb 6.4 oz (70.5 kg)   LMP 10/07/2016 (Approximate)   SpO2 98%   BMI 25.39 kg/m   Wt Readings from Last 3 Encounters:  08/06/21 155 lb 6.4 oz (70.5 kg)  03/08/21 153 lb 12.8 oz (69.8 kg)  02/03/21 155 lb 9.6 oz (70.6 kg)    Physical Exam Vitals and nursing note reviewed. Exam conducted with a chaperone present.  Constitutional:      General: She is awake. She is not in acute distress.    Appearance: She is well-developed. She is not ill-appearing.  HENT:     Head: Normocephalic and atraumatic.     Right Ear: Hearing, tympanic membrane, ear canal and external ear normal. No drainage.     Left Ear: Hearing, tympanic membrane, ear canal and external ear normal. No drainage.     Nose: Nose normal.     Right Sinus: No maxillary sinus tenderness or frontal sinus tenderness.     Left Sinus: No maxillary sinus tenderness or frontal sinus tenderness.     Mouth/Throat:     Mouth: Mucous membranes are moist.     Pharynx: Oropharynx is clear. Uvula midline. No pharyngeal swelling, oropharyngeal exudate or posterior oropharyngeal erythema.  Eyes:     General: Lids are normal.        Right eye: No discharge.        Left eye: No discharge.     Extraocular Movements: Extraocular movements intact.     Conjunctiva/sclera: Conjunctivae normal.     Pupils: Pupils are equal, round, and reactive to light.     Visual Fields: Right eye visual fields normal and left eye visual fields normal.  Neck:     Thyroid: No thyromegaly.     Vascular: No carotid bruit.     Trachea: Trachea normal.  Cardiovascular:     Rate and Rhythm: Normal rate and regular rhythm.      Heart sounds: Normal heart sounds. No murmur heard.    No gallop.  Pulmonary:     Effort: Pulmonary effort is normal. No accessory muscle usage or respiratory distress.     Breath sounds: Normal breath sounds.  Chest:  Breasts:    Right: Normal.     Left: Normal.  Abdominal:     General: Bowel sounds are normal.     Palpations: Abdomen is soft. There is no hepatomegaly or splenomegaly.     Tenderness: There is no abdominal tenderness.  Musculoskeletal:        General: Normal range of motion.     Cervical back: Normal range of motion and neck supple.     Right lower leg: No edema.     Left lower leg: No edema.  Lymphadenopathy:     Head:     Right side of head: No submental, submandibular, tonsillar, preauricular or posterior auricular adenopathy.     Left side of head: No submental, submandibular, tonsillar, preauricular or posterior auricular adenopathy.     Cervical: No cervical adenopathy.     Upper Body:     Right upper body: No supraclavicular, axillary or pectoral adenopathy.     Left upper body: No supraclavicular, axillary or pectoral adenopathy.  Skin:  General: Skin is warm and dry.     Capillary Refill: Capillary refill takes less than 2 seconds.     Findings: No rash.  Neurological:     Mental Status: She is alert and oriented to person, place, and time.     Gait: Gait is intact.     Deep Tendon Reflexes: Reflexes are normal and symmetric.     Reflex Scores:      Brachioradialis reflexes are 2+ on the right side and 2+ on the left side.      Patellar reflexes are 2+ on the right side and 2+ on the left side. Psychiatric:        Attention and Perception: Attention normal.        Mood and Affect: Mood normal.        Speech: Speech normal.        Behavior: Behavior normal. Behavior is cooperative.        Thought Content: Thought content normal.        Judgment: Judgment normal.    Results for orders placed or performed in visit on 02/03/21  ANA w/Reflex if  Positive  Result Value Ref Range   Anti Nuclear Antibody (ANA) Negative Negative  C-reactive protein  Result Value Ref Range   CRP 38 (H) 0 - 10 mg/L  Sed Rate (ESR)  Result Value Ref Range   Sed Rate 13 0 - 40 mm/hr  Comp Met (CMET)  Result Value Ref Range   Glucose 94 70 - 99 mg/dL   BUN 12 6 - 24 mg/dL   Creatinine, Ser 0.67 0.57 - 1.00 mg/dL   eGFR 101 >59 mL/min/1.73   BUN/Creatinine Ratio 18 9 - 23   Sodium 141 134 - 144 mmol/L   Potassium 4.5 3.5 - 5.2 mmol/L   Chloride 103 96 - 106 mmol/L   CO2 26 20 - 29 mmol/L   Calcium 9.5 8.7 - 10.2 mg/dL   Total Protein 6.7 6.0 - 8.5 g/dL   Albumin 4.3 3.8 - 4.9 g/dL   Globulin, Total 2.4 1.5 - 4.5 g/dL   Albumin/Globulin Ratio 1.8 1.2 - 2.2   Bilirubin Total 0.3 0.0 - 1.2 mg/dL   Alkaline Phosphatase 108 44 - 121 IU/L   AST 17 0 - 40 IU/L   ALT 18 0 - 32 IU/L  Lipid Panel w/o Chol/HDL Ratio  Result Value Ref Range   Cholesterol, Total 231 (H) 100 - 199 mg/dL   Triglycerides 138 0 - 149 mg/dL   HDL 43 >39 mg/dL   VLDL Cholesterol Cal 25 5 - 40 mg/dL   LDL Chol Calc (NIH) 163 (H) 0 - 99 mg/dL      Assessment & Plan:   Problem List Items Addressed This Visit       Cardiovascular and Mediastinum   Hypertension    Chronic, stable with BP below goal in office today on recheck.  Continue Lisinopril 20 MG daily.  Recommend she monitor BP at home at least 3 mornings a week and document + focus on DASH diet.  LABS: CMP, CBC, TSH.  Return in 6 months for follow-up.      Relevant Medications   lisinopril (ZESTRIL) 20 MG tablet   Other Relevant Orders   CBC with Differential/Platelet   Comprehensive metabolic panel   TSH     Other   BMI 26.0-26.9,adult    Recommended eating smaller high protein, low fat meals more frequently and exercising 30 mins a day 5 times  a week with a goal of 10-15lb weight loss in the next 3 months. Patient voiced their understanding and motivation to adhere to these recommendations.        Elevated low density lipoprotein (LDL) cholesterol level    Noted on past labs with LDL <190 and ASCVD 6.8%.  No history of MI or CVA.  At this time recheck lipid panel and continue diet focus.      Relevant Orders   Comprehensive metabolic panel   Lipid Panel w/o Chol/HDL Ratio   Other Visit Diagnoses     Encounter for annual physical exam    -  Primary   Annual physical with labs today and health maintenance reviewed.   Vitamin D deficiency       History of low levels reported, recheck today and initiate supplement as needed.   Relevant Orders   VITAMIN D 25 Hydroxy (Vit-D Deficiency, Fractures)   Colon cancer screening       GI referral placed.   Relevant Orders   Ambulatory referral to Gastroenterology        Follow up plan: Return in about 6 months (around 02/05/2022) for HTN/HLD -- also needs pap done.   LABORATORY TESTING:  - Pap smear: obtain in December  IMMUNIZATIONS:   - Tdap: Tetanus vaccination status reviewed: refused. - Influenza: Up to date - Pneumovax: Not applicable - Prevnar: Not applicable - HPV: Not applicable - Zostavax vaccine: Refused  SCREENING: -Mammogram: Up to date -- is going today - Colonoscopy: Ordered today  - Bone Density: Not applicable  -Hearing Test: Not applicable  -Spirometry: Not applicable   PATIENT COUNSELING:   Advised to take 1 mg of folate supplement per day if capable of pregnancy.   Sexuality: Discussed sexually transmitted diseases, partner selection, use of condoms, avoidance of unintended pregnancy  and contraceptive alternatives.   Advised to avoid cigarette smoking.  I discussed with the patient that most people either abstain from alcohol or drink within safe limits (<=14/week and <=4 drinks/occasion for males, <=7/weeks and <= 3 drinks/occasion for females) and that the risk for alcohol disorders and other health effects rises proportionally with the number of drinks per week and how often a drinker exceeds  daily limits.  Discussed cessation/primary prevention of drug use and availability of treatment for abuse.   Diet: Encouraged to adjust caloric intake to maintain  or achieve ideal body weight, to reduce intake of dietary saturated fat and total fat, to limit sodium intake by avoiding high sodium foods and not adding table salt, and to maintain adequate dietary potassium and calcium preferably from fresh fruits, vegetables, and low-fat dairy products.    Stressed the importance of regular exercise  Injury prevention: Discussed safety belts, safety helmets, smoke detector, smoking near bedding or upholstery.   Dental health: Discussed importance of regular tooth brushing, flossing, and dental visits.    NEXT PREVENTATIVE PHYSICAL DUE IN 1 YEAR. Return in about 6 months (around 02/05/2022) for HTN/HLD -- also needs pap done.

## 2021-08-06 NOTE — Assessment & Plan Note (Addendum)
Chronic, stable with BP below goal in office today on recheck.  Continue Lisinopril 20 MG daily.  Recommend she monitor BP at home at least 3 mornings a week and document + focus on DASH diet.  LABS: CMP, CBC, TSH.  Return in 6 months for follow-up.

## 2021-08-06 NOTE — Assessment & Plan Note (Signed)
Recommended eating smaller high protein, low fat meals more frequently and exercising 30 mins a day 5 times a week with a goal of 10-15lb weight loss in the next 3 months. Patient voiced their understanding and motivation to adhere to these recommendations.  

## 2021-08-06 NOTE — Patient Instructions (Signed)

## 2021-08-07 LAB — COMPREHENSIVE METABOLIC PANEL
ALT: 21 IU/L (ref 0–32)
AST: 19 IU/L (ref 0–40)
Albumin/Globulin Ratio: 1.8 (ref 1.2–2.2)
Albumin: 4.6 g/dL (ref 3.8–4.9)
Alkaline Phosphatase: 113 IU/L (ref 44–121)
BUN/Creatinine Ratio: 16 (ref 12–28)
BUN: 12 mg/dL (ref 8–27)
Bilirubin Total: 0.5 mg/dL (ref 0.0–1.2)
CO2: 25 mmol/L (ref 20–29)
Calcium: 10.1 mg/dL (ref 8.7–10.3)
Chloride: 101 mmol/L (ref 96–106)
Creatinine, Ser: 0.75 mg/dL (ref 0.57–1.00)
Globulin, Total: 2.5 g/dL (ref 1.5–4.5)
Glucose: 81 mg/dL (ref 70–99)
Potassium: 4.2 mmol/L (ref 3.5–5.2)
Sodium: 142 mmol/L (ref 134–144)
Total Protein: 7.1 g/dL (ref 6.0–8.5)
eGFR: 91 mL/min/{1.73_m2} (ref >59)

## 2021-08-07 LAB — CBC WITH DIFFERENTIAL/PLATELET
Basophils Absolute: 0.1 10*3/uL (ref 0.0–0.2)
Basos: 1 %
EOS (ABSOLUTE): 0.2 10*3/uL (ref 0.0–0.4)
Eos: 2 %
Hematocrit: 41.3 % (ref 34.0–46.6)
Hemoglobin: 13.8 g/dL (ref 11.1–15.9)
Immature Grans (Abs): 0.1 10*3/uL (ref 0.0–0.1)
Immature Granulocytes: 1 %
Lymphocytes Absolute: 2.9 10*3/uL (ref 0.7–3.1)
Lymphs: 33 %
MCH: 29.7 pg (ref 26.6–33.0)
MCHC: 33.4 g/dL (ref 31.5–35.7)
MCV: 89 fL (ref 79–97)
Monocytes Absolute: 0.5 10*3/uL (ref 0.1–0.9)
Monocytes: 6 %
Neutrophils Absolute: 5 10*3/uL (ref 1.4–7.0)
Neutrophils: 57 %
Platelets: 226 10*3/uL (ref 150–450)
RBC: 4.64 x10E6/uL (ref 3.77–5.28)
RDW: 14.3 % (ref 11.7–15.4)
WBC: 8.7 10*3/uL (ref 3.4–10.8)

## 2021-08-07 LAB — TSH: TSH: 2.47 u[IU]/mL (ref 0.450–4.500)

## 2021-08-07 LAB — VITAMIN D 25 HYDROXY (VIT D DEFICIENCY, FRACTURES): Vit D, 25-Hydroxy: 31.4 ng/mL (ref 30.0–100.0)

## 2021-08-07 LAB — LIPID PANEL W/O CHOL/HDL RATIO
Cholesterol, Total: 240 mg/dL — ABNORMAL HIGH (ref 100–199)
HDL: 46 mg/dL (ref >39)
LDL Chol Calc (NIH): 158 mg/dL — ABNORMAL HIGH (ref 0–99)
Triglycerides: 197 mg/dL — ABNORMAL HIGH (ref 0–149)
VLDL Cholesterol Cal: 36 mg/dL (ref 5–40)

## 2021-08-08 NOTE — Progress Notes (Signed)
Contacted via MyChart The 10-year ASCVD risk score (Arnett DK, et al., 2019) is: 6.7%   Values used to calculate the score:     Age: 61 years     Sex: Female     Is Non-Hispanic African American: No     Diabetic: No     Tobacco smoker: No     Systolic Blood Pressure: 428 mmHg     Is BP treated: Yes     HDL Cholesterol: 46 mg/dL     Total Cholesterol: 240 mg/dL  Good morning Alicia Copeland, your labs have returned.  Overall they are stable with no medication changes needed, the only concern is cholesterol levels.  Your cholesterol is still high, but continued recommendations to make lifestyle changes. Your LDL is above normal. The LDL is the bad cholesterol. Over time and in combination with inflammation and other factors, this contributes to plaque which in turn may lead to stroke and/or heart attack down the road. Sometimes high LDL is primarily genetic, and people might be eating all the right foods but still have high numbers. Other times, there is room for improvement in one's diet and eating healthier can bring this number down and potentially reduce one's risk of heart attack and/or stroke.   To reduce your LDL, Remember - more fruits and vegetables, more fish, and limit red meat and dairy products. More soy, nuts, beans, barley, lentils, oats and plant sterol ester enriched margarine instead of butter. I also encourage eliminating sugar and processed food. Remember, shop on the outside of the grocery store and visit your [employer]. If you would like to talk with me about dietary changes plus or minus medications for your cholesterol, please let me know. We should recheck your cholesterol in 6 months.  Any questions? Keep being amazing!!  Thank you for allowing me to participate in your care.  I appreciate you. Kindest regards, Javier Mamone

## 2021-08-11 ENCOUNTER — Other Ambulatory Visit: Payer: Self-pay

## 2021-08-11 ENCOUNTER — Other Ambulatory Visit: Payer: Self-pay | Admitting: Gastroenterology

## 2021-08-11 DIAGNOSIS — Z86010 Personal history of colonic polyps: Secondary | ICD-10-CM

## 2021-08-11 MED ORDER — SUTAB 1479-225-188 MG PO TABS: 12.0000 | ORAL_TABLET | Freq: Once | ORAL | 0 refills | Status: AC

## 2021-08-11 MED ORDER — NA SULFATE-K SULFATE-MG SULF 17.5-3.13-1.6 GM/177ML PO SOLN: 1.0000 | Freq: Once | ORAL | 0 refills | Status: AC

## 2021-08-11 NOTE — Progress Notes (Unsigned)
Gastroenterology Pre-Procedure Review  Request Date: 10/04/2021 Requesting Physician: Dr. Vicente Males  PATIENT REVIEW QUESTIONS: The patient responded to the following health history questions as indicated:    1. Are you having any GI issues? no 2. Do you have a personal history of Polyps? yes (last colonoscopy ) 3. Do you have a family history of Colon Cancer or Polyps? Colon cancer grandmother  4. Diabetes Mellitus? no 5. Joint replacements in the past 12 months?no 6. Major health problems in the past 3 months?no 7. Any artificial heart valves, MVP, or defibrillator?no    MEDICATIONS & ALLERGIES:    Patient reports the following regarding taking any anticoagulation/antiplatelet therapy:   Plavix, Coumadin, Eliquis, Xarelto, Lovenox, Pradaxa, Brilinta, or Effient? no Aspirin? no  Patient confirms/reports the following medications:  Current Outpatient Medications  Medication Sig Dispense Refill   diclofenac Sodium (VOLTAREN) 1 % GEL Apply 2 g topically 4 (four) times daily. 50 g 4   lisinopril (ZESTRIL) 20 MG tablet Take 1 tablet (20 mg total) by mouth daily. 90 tablet 4   No current facility-administered medications for this visit.    Patient confirms/reports the following allergies:  Allergies  Allergen Reactions   Sulfa Antibiotics Hives and Other (See Comments)    Alters liver function. "breaks out in liver"    No orders of the defined types were placed in this encounter.   AUTHORIZATION INFORMATION Primary Insurance: 1D#: Group #:  Secondary Insurance: 1D#: Group #:  SCHEDULE INFORMATION: Date: 08/28/23023 Time: Location: [facility]

## 2021-08-11 NOTE — Progress Notes (Signed)
Contacted via MyChart   Normal mammogram, may repeat in one year:)

## 2021-10-01 ENCOUNTER — Encounter: Payer: Self-pay | Admitting: Gastroenterology

## 2021-10-04 ENCOUNTER — Ambulatory Visit: Payer: BC Managed Care – PPO | Admitting: Registered Nurse

## 2021-10-04 ENCOUNTER — Encounter
Admission: RE | Disposition: A | Discharge status: Home / Self Care | Payer: Self-pay | Source: Home / Self Care | Attending: Gastroenterology

## 2021-10-04 ENCOUNTER — Ambulatory Visit
Admission: RE | Admit: 2021-10-04 | Discharge: 2021-10-04 | Disposition: A | Discharge status: Home / Self Care | Payer: BC Managed Care – PPO | Attending: Gastroenterology | Admitting: Gastroenterology

## 2021-10-04 ENCOUNTER — Encounter: Payer: Self-pay | Admitting: Gastroenterology

## 2021-10-04 DIAGNOSIS — D122 Benign neoplasm of ascending colon: Secondary | ICD-10-CM | POA: Insufficient documentation

## 2021-10-04 DIAGNOSIS — K5730 Diverticulosis of large intestine without perforation or abscess without bleeding: Secondary | ICD-10-CM | POA: Insufficient documentation

## 2021-10-04 DIAGNOSIS — Z86010 Personal history of colonic polyps: Secondary | ICD-10-CM

## 2021-10-04 DIAGNOSIS — Z1211 Encounter for screening for malignant neoplasm of colon: Secondary | ICD-10-CM

## 2021-10-04 DIAGNOSIS — K635 Polyp of colon: Secondary | ICD-10-CM | POA: Diagnosis not present

## 2021-10-04 DIAGNOSIS — D126 Benign neoplasm of colon, unspecified: Secondary | ICD-10-CM

## 2021-10-04 DIAGNOSIS — I10 Essential (primary) hypertension: Secondary | ICD-10-CM | POA: Diagnosis not present

## 2021-10-04 HISTORY — PX: COLONOSCOPY WITH PROPOFOL: SHX5780

## 2021-10-04 SURGERY — COLONOSCOPY WITH PROPOFOL
Anesthesia: General

## 2021-10-04 MED ORDER — PROPOFOL 10 MG/ML IV BOLUS
INTRAVENOUS | Status: DC | PRN
  Administered 2021-10-04: 20 mg via INTRAVENOUS
  Administered 2021-10-04: 70 mg via INTRAVENOUS
  Administered 2021-10-04 (×2): 20 mg via INTRAVENOUS

## 2021-10-04 MED ORDER — LABETALOL HCL 5 MG/ML IV SOLN
INTRAVENOUS | Status: DC | PRN
  Administered 2021-10-04: 5 mg via INTRAVENOUS

## 2021-10-04 MED ORDER — SODIUM CHLORIDE 0.9 % IV SOLN: INTRAVENOUS | Status: DC

## 2021-10-04 MED ORDER — LABETALOL HCL 5 MG/ML IV SOLN
INTRAVENOUS | Status: AC
  Filled 2021-10-04: qty 4

## 2021-10-04 MED ORDER — PROPOFOL 500 MG/50ML IV EMUL
INTRAVENOUS | Status: DC | PRN
  Administered 2021-10-04: 175 ug/kg/min via INTRAVENOUS

## 2021-10-04 MED ORDER — DEXMEDETOMIDINE HCL 200 MCG/2ML IV SOLN
INTRAVENOUS | Status: DC | PRN
  Administered 2021-10-04: 8 ug via INTRAVENOUS
  Administered 2021-10-04: 12 ug via INTRAVENOUS

## 2021-10-04 MED ORDER — LIDOCAINE HCL (CARDIAC) PF 100 MG/5ML IV SOSY
PREFILLED_SYRINGE | INTRAVENOUS | Status: DC | PRN
  Administered 2021-10-04: 100 mg via INTRAVENOUS

## 2021-10-04 NOTE — Anesthesia Postprocedure Evaluation (Signed)
Anesthesia Post Note  Patient: Alicia Copeland  Procedure(s) Performed: COLONOSCOPY WITH PROPOFOL  Patient location during evaluation: Endoscopy Anesthesia Type: General Level of consciousness: awake and alert Pain management: pain level controlled Vital Signs Assessment: post-procedure vital signs reviewed and stable Respiratory status: spontaneous breathing, nonlabored ventilation, respiratory function stable and patient connected to nasal cannula oxygen Cardiovascular status: blood pressure returned to baseline and stable Postop Assessment: no apparent nausea or vomiting Anesthetic complications: no   No notable events documented.   Last Vitals:  Vitals:   10/04/21 0919 10/04/21 0929  BP: (!) 83/71 113/69  Pulse: 71 74  Resp: 17 15  Temp:    SpO2: 97% 96%    Last Pain:  Vitals:   10/04/21 0929  TempSrc:   PainSc: 2                  Precious Haws [name]

## 2021-10-04 NOTE — Transfer of Care (Signed)
Immediate Anesthesia Transfer of Care Note  Patient: Alicia Copeland  Procedure(s) Performed: COLONOSCOPY WITH PROPOFOL  Patient Location: Endoscopy Unit  Anesthesia Type:General  Level of Consciousness: drowsy  Airway & Oxygen Therapy: Patient Spontanous Breathing  Post-op Assessment: Report given to RN and Post -op Vital signs reviewed and stable  Post vital signs: Reviewed and stable  Last Vitals:  Vitals Value Taken Time  BP 92/51 10/04/21 0909  Temp 36.2 C 10/04/21 0909  Pulse 73 10/04/21 0910  Resp 21 10/04/21 0910  SpO2 90 % 10/04/21 0910  Vitals shown include unvalidated device data.  Last Pain:  Vitals:   10/04/21 0909  TempSrc: Tympanic  PainSc: Asleep         Complications: No notable events documented.

## 2021-10-04 NOTE — H&P (Signed)
Jonathon Bellows, MD [address] [place], [place], [place], [address] [id number], [place], [place], [place], [address] Phone: [phone]  Fax: [phone]  Primary Care Physician:  Venita Lick, NP   Pre-Procedure History & Physical: HPI:  Alicia Copeland is a 61 y.o. female is here for an colonoscopy.   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   Hypertension 2009   Lump or mass in breast 2011   Special screening for malignant neoplasms, colon     Past Surgical History:  Procedure Laterality Date   BREAST BIOPSY Right 2007   Fibroadenoma, core biopsy, ER/PR negative.done in [place] Right 03/11/2010   Reexcision right breast lesion at the 10:00 position with no evidence of residual flow in tumor   BREAST SURGERY Right 02/04/2010   Right breast re-excision, no evidence of recurrent phyllodes tumor   COLONOSCOPY  2012   Dr. Candace Cruise    Prior to Admission medications   Medication Sig Start Date End Date Taking? Authorizing Provider  lisinopril (ZESTRIL) 20 MG tablet Take 1 tablet (20 mg total) by mouth daily. 08/06/21  Yes Cannady, Jolene T, NP  diclofenac Sodium (VOLTAREN) 1 % GEL Apply 2 g topically 4 (four) times daily. 02/03/21   Marnee Guarneri T, NP    Allergies as of 08/11/2021 - Review Complete 08/11/2021  Allergen Reaction Noted   Sulfa antibiotics Hives and Other (See Comments) 07/30/2012    Family History  Problem Relation Age of Onset   Diabetes Mother    Breast cancer Mother    Colon cancer Mother        breast cancer, lymphoma, greater than age 100   Cancer Maternal Grandmother 68       colon cancer, greater than age 20   Cancer Paternal Grandmother        breast cancer, greater than  age 49   Breast cancer Paternal Grandmother    Hypertension Father    Diabetes Father    Heart attack Father    Hypertension Brother    Heart attack Maternal Grandfather     Social History   Socioeconomic History   Marital status: Married    Spouse name: Not on file   Number of children: Not on file   Years of education: Not on file   Highest education level: Not on file  Occupational History   Not on file  Tobacco Use   Smoking status: Never   Smokeless tobacco: Never  Vaping Use   Vaping Use: Never used  Substance and Sexual Activity   Alcohol use: No   Drug use: No   Sexual activity: Yes    Birth control/protection: Post-menopausal  Other Topics Concern   Not on file  Social History Narrative   Not on file   Social Determinants of Health   Financial Resource Strain: Not on file  Food Insecurity: Not on file  Transportation Needs: Not on file  Physical Activity: Not on file  Stress: Not on file  Social Connections: Not on file  Intimate Partner Violence: Not on file    Review of Systems: See HPI, otherwise negative ROS  Physical Exam: BP (!) 179/93   Pulse 98   Temp (!) 97.4  F (36.3 C) (Temporal)   Resp 18   Ht '5\' 4"'$  (1.626 m)   Wt 68 kg   LMP 10/07/2016 (Approximate)   SpO2 98%   BMI 25.75 kg/m  General:   Alert,  pleasant and cooperative in NAD Head:  Normocephalic and atraumatic. Neck:  Supple; no masses or thyromegaly. Lungs:  Clear throughout to auscultation, normal respiratory effort.    Heart:  +S1, +S2, Regular rate and rhythm, No edema. Abdomen:  Soft, nontender and nondistended. Normal bowel sounds, without guarding, and without rebound.   Neurologic:  Alert and  oriented x4;  grossly normal neurologically.  Impression/Plan: [name] is here for an colonoscopy to be performed for Screening colonoscopy average risk   Risks, benefits, limitations, and alternatives regarding  colonoscopy have been reviewed with the patient.   Questions have been answered.  All parties agreeable.   Jonathon Bellows, MD  10/04/2021, 8:32 AM

## 2021-10-04 NOTE — Op Note (Signed)
[facility] / [facility] Patient Name: Alicia Copeland Procedure Date: 10/04/2021 8:36 AM MRN: [id number] Account #: [id number] Date of Birth: Jan 12, 1961 Admit Type: Outpatient Age: 61 Room: [facility] ENDO ROOM 1 Gender: Female Note Status: Finalized Instrument Name: Park Meo [id number] Procedure:             Colonoscopy Indications:           Screening for colorectal malignant neoplasm Providers:             Jonathon Bellows MD, MD Referring MD:          Barbaraann Faster. Ned Card (Referring MD) Medicines:             Monitored Anesthesia Care Complications:         No immediate complications. Procedure:             Pre-Anesthesia Assessment:                        - Prior to the procedure, a History and Physical was                         performed, and patient medications, allergies and                         sensitivities were reviewed. The patient's tolerance                         of previous anesthesia was reviewed.                        - The risks and benefits of the procedure and the                         sedation options and risks were discussed with the                         patient. All questions were answered and informed                         consent was obtained.                        - ASA Grade Assessment: II - A patient with mild                         systemic disease.                        After obtaining informed consent, the colonoscope was                         passed under direct vision. Throughout the procedure,                         the patient's blood pressure, pulse, and oxygen                         saturations were monitored continuously. The                         Colonoscope was introduced  through the anus and                         advanced to the the cecum, identified by the                         appendiceal orifice. The colonoscopy was performed                         with ease. The patient tolerated the procedure well.                          The quality of the bowel preparation was good. Findings:      The perianal and digital rectal examinations were normal.      Multiple small and large-mouthed diverticula were found in the sigmoid       colon.      A 4 mm polyp was found in the sigmoid colon. The polyp was sessile. The       polyp was removed with a jumbo cold forceps. Resection and retrieval       were complete.      A 5 mm polyp was found in the ascending colon. The polyp was sessile.       The polyp was removed with a cold snare. Resection and retrieval were       complete.      A 15 mm polyp was found in the ascending colon. The polyp was sessile.       Preparations were made for mucosal resection. Saline was injected to       raise the lesion. Snare mucosal resection was performed. Resection and       retrieval were complete. To prevent bleeding after the polypectomy, two       hemostatic clips were successfully placed. There was no bleeding during,       or at the end, of the procedure. borders of polyp marked using blue or       nbi light      The exam was otherwise without abnormality on direct and retroflexion       views. Impression:            - Diverticulosis in the sigmoid colon.                        - One 4 mm polyp in the sigmoid colon, removed with a                         jumbo cold forceps. Resected and retrieved.                        - One 5 mm polyp in the ascending colon, removed with                         a cold snare. Resected and retrieved.                        - One 15 mm polyp in the ascending colon, removed with                         mucosal resection. Resected and retrieved. Clips  were                         placed.                        - The examination was otherwise normal on direct and                         retroflexion views.                        - Mucosal resection was performed. Resection and                         retrieval were  complete. Recommendation:        - Discharge patient to home (with escort).                        - Resume previous diet.                        - Continue present medications.                        - Await pathology results.                        - Repeat colonoscopy for surveillance based on                         pathology results. Procedure Code(s):     --- Professional ---                        [id number], Colonoscopy, flexible; with endoscopic mucosal                         resection                        45385, 84, Colonoscopy, flexible; with removal of                         tumor(s), polyp(s), or other lesion(s) by snare                         technique                        45380, 53, Colonoscopy, flexible; with biopsy, single                         or multiple Diagnosis Code(s):     --- Professional ---                        Z12.11, Encounter for screening for malignant neoplasm                         of colon                        K63.5, Polyp of colon  K57.30, Diverticulosis of large intestine without                         perforation or abscess without bleeding CPT copyright 2019 American Medical Association. All rights reserved. The codes documented in this report are preliminary and upon coder review may  be revised to meet current compliance requirements. Jonathon Bellows, MD Jonathon Bellows MD, MD 10/04/2021 9:10:02 AM This report has been signed electronically. Number of Addenda: 0 Note Initiated On: 10/04/2021 8:36 AM Scope Withdrawal Time: 0 hours 15 minutes 25 seconds  Total Procedure Duration: 0 hours 23 minutes 1 second  Estimated Blood Loss:  Estimated blood loss: none.      [facility]

## 2021-10-04 NOTE — Anesthesia Preprocedure Evaluation (Signed)
Anesthesia Evaluation  Patient identified by MRN, date of birth, ID band Patient awake    Reviewed: Allergy & Precautions, NPO status , Patient's Chart, lab work & pertinent test results  History of Anesthesia Complications Negative for: history of anesthetic complications  Airway Mallampati: III  TM Distance: <3 FB Neck ROM: full    Dental  (+) Chipped   Pulmonary neg pulmonary ROS, neg shortness of breath,    Pulmonary exam normal        Cardiovascular Exercise Tolerance: Good hypertension, (-) anginaNormal cardiovascular exam     Neuro/Psych negative neurological ROS  negative psych ROS   GI/Hepatic negative GI ROS, Neg liver ROS, neg GERD  ,  Endo/Other  negative endocrine ROS  Renal/GU negative Renal ROS  negative genitourinary   Musculoskeletal   Abdominal   Peds  Hematology negative hematology ROS (+)   Anesthesia Other Findings Past Medical History: 01/2010: Hx of breast biopsy     Comment:  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 2009: Hypertension 2011: Lump or mass in breast No date: Special screening for malignant neoplasms, colon  Past Surgical History: 2007: BREAST BIOPSY; Right     Comment:  Fibroadenoma, core biopsy, ER/PR negative.done in [facility] [address] 03/11/2010: BREAST SURGERY; Right     Comment:  Reexcision right breast lesion at the 10:00 position               with no evidence of residual flow in tumor 02/04/2010: BREAST SURGERY; Right     Comment:  Right breast re-excision, no evidence of recurrent               phyllodes tumor 2012: COLONOSCOPY     Comment:  Dr. Candace Cruise  BMI    Body Mass Index: 25.75 kg/m      Reproductive/Obstetrics negative OB  ROS                             Anesthesia Physical Anesthesia Plan  ASA: 2  Anesthesia Plan: General   Post-op Pain Management:    Induction: Intravenous  PONV Risk Score and Plan: Propofol infusion and TIVA  Airway Management Planned: Natural Airway and Nasal Cannula  Additional Equipment:   Intra-op Plan:   Post-operative Plan:   Informed Consent: I have reviewed the patients History and Physical, chart, labs and discussed the procedure including the risks, benefits and alternatives for the proposed anesthesia with the patient or authorized representative who has indicated his/her understanding and acceptance.     Dental Advisory Given  Plan Discussed with: Anesthesiologist, CRNA and Surgeon  Anesthesia Plan Comments: (Patient consented for risks of anesthesia including but not limited to:  - adverse reactions to medications - risk of airway placement if required - damage to eyes, teeth, lips or other oral mucosa - nerve damage due to positioning  - sore throat or hoarseness - Damage to heart, brain, nerves, lungs, other parts of body or loss of life  Patient voiced understanding.)  Anesthesia Quick Evaluation  

## 2021-10-05 ENCOUNTER — Encounter: Payer: Self-pay | Admitting: Gastroenterology

## 2021-10-05 LAB — SURGICAL PATHOLOGY

## 2021-10-06 ENCOUNTER — Encounter: Payer: Self-pay | Admitting: Gastroenterology

## 2021-11-08 ENCOUNTER — Ambulatory Visit: Payer: Self-pay

## 2021-11-08 ENCOUNTER — Other Ambulatory Visit: Payer: Self-pay

## 2021-11-08 ENCOUNTER — Other Ambulatory Visit: Payer: BC Managed Care – PPO

## 2021-11-08 DIAGNOSIS — R8281 Pyuria: Secondary | ICD-10-CM

## 2021-11-08 DIAGNOSIS — R399 Unspecified symptoms and signs involving the genitourinary system: Secondary | ICD-10-CM

## 2021-11-08 LAB — MICROSCOPIC EXAMINATION: Bacteria, UA: NONE SEEN

## 2021-11-08 LAB — URINALYSIS, ROUTINE W REFLEX MICROSCOPIC
Bilirubin, UA: NEGATIVE
Glucose, UA: NEGATIVE
Ketones, UA: NEGATIVE
Nitrite, UA: NEGATIVE
Protein,UA: NEGATIVE
Specific Gravity, UA: 1.015 (ref 1.005–1.030)
Urobilinogen, Ur: 0.2 mg/dL (ref 0.2–1.0)
pH, UA: 7 (ref 5.0–7.5)

## 2021-11-08 MED ORDER — AMOXICILLIN-POT CLAVULANATE 875-125 MG PO TABS: 1.0000 | ORAL_TABLET | Freq: Two times a day (BID) | ORAL | 0 refills | Status: DC

## 2021-11-08 NOTE — Telephone Encounter (Signed)
  Chief Complaint: UTI Symptoms: urinary pressure, dysuria, urgency Frequency: 5-6 days Pertinent Negatives: NA Disposition: '[]'$ ED /'[]'$ Urgent Care (no appt availability in office) / '[x]'$ Appointment(In office/virtual)/ '[]'$  [facility]/ '[]'$ Home Care/ '[]'$ Refused Recommended Disposition /'[]'$ [facility]/ '[]'$  Follow-up with PCP Additional Notes: pt has had sx ongoing for several days. She has been forcing fluids and cranberry juice but not helping with sx. Scheduled VV tomorrw at [place] with Henrine Screws, NP. Pt is asking if she can come to lab to give urine sample today. I called and spoke with [facility] who asked to send message back for review and CMA would fu with pt. Advised pt of this and she verbalized understanding.   Summary: poss uti   Pt called  saying she has the symptoms of a UTI.  She can't get to the office by 10:20 and wants to know if she can come by and give a sample.  The other appt is tomorrow and she has been having thee symptoms for about a week.   CB@  [phone]      Reason for Disposition  Urinating more frequently than usual (i.e., frequency)  Answer Assessment - Initial Assessment Questions 1. SYMPTOM: "What's the main symptom you're concerned about?" (e.g., frequency, incontinence)     dysuria 2. ONSET: "When did the  sx  start?"     5-6 days 4. CAUSE: "What do you think is causing the symptoms?"     Possible UTI 5. OTHER SYMPTOMS: "Do you have any other symptoms?" (e.g., blood in urine, fever, flank pain, pain with urination)     Dysuria, urgency, pressure  Protocols used: Urinary Symptoms-A-AH

## 2021-11-08 NOTE — Telephone Encounter (Signed)
Pt scheduled for lab

## 2021-11-08 NOTE — Addendum Note (Signed)
Addended by: Marnee Guarneri T on: 11/08/2021 03:16 PM   Modules accepted: Orders

## 2021-11-08 NOTE — Progress Notes (Signed)
I messaged Ana, but just in case as her default is showing busy, please alert her culture was placed.

## 2021-11-08 NOTE — Addendum Note (Signed)
Addended by: Marnee Guarneri T on: 11/08/2021 12:42 PM   Modules accepted: Orders

## 2021-11-08 NOTE — Addendum Note (Signed)
Addended by: Marnee Guarneri T on: 11/08/2021 03:13 PM   Modules accepted: Orders

## 2021-11-09 ENCOUNTER — Telehealth (INDEPENDENT_AMBULATORY_CARE_PROVIDER_SITE_OTHER): Payer: BC Managed Care – PPO | Admitting: Nurse Practitioner

## 2021-11-09 ENCOUNTER — Encounter: Payer: Self-pay | Admitting: Nurse Practitioner

## 2021-11-09 DIAGNOSIS — N390 Urinary tract infection, site not specified: Secondary | ICD-10-CM | POA: Insufficient documentation

## 2021-11-09 DIAGNOSIS — N3001 Acute cystitis with hematuria: Secondary | ICD-10-CM | POA: Diagnosis not present

## 2021-11-09 NOTE — Progress Notes (Signed)
LMP 10/07/2016 (Approximate)    Subjective:    Patient ID: Alicia Copeland, female    DOB: [birth year], 61 y.o.   MRN: [id number]  HPI: Alicia Copeland is a 61 y.o. female  Chief Complaint  Patient presents with   Urinary Tract Infection    Patient is here for possible UTI. Patient says she was able to pick up medication. Patient says yesterday after dropping off her sample, she noticed some back pain. Patient says she has taken 1 dose and Tylenol and her symptoms have subsided.    This visit was completed via video visit through MyChart due to the restrictions of the COVID-19 pandemic. All issues as above were discussed and addressed. Physical exam was done as above through visual confirmation on video through MyChart. If it was felt that the patient should be evaluated in the office, they were directed there. The patient verbally consented to this visit. Location of the patient: home Location of the provider: home Those involved with this call:  Provider: Marnee Guarneri, DNP CMA: Irena Reichmann, [place] Desk/Registration: [employer]  Time spent on call:  21 minutes with patient face to face via video conference. More than 50% of this time was spent in counseling and coordination of care. 15 minutes total spent in review of patient's record and preparation of their chart.  I verified patient identity using two factors (patient name and date of birth). Patient consents verbally to being seen via telemedicine visit today.    URINARY SYMPTOMS Symptoms started last week, had gone to beach with sister and her husband -- symptoms started last 24 hours while there.  Tried drinking water to flush out.  Is taking Augmentin started last night + Tylenol for back pain.  Symptoms improving at this time. Dysuria: burning and pressure Urinary frequency: yes Urgency: yes Small volume voids: yes Symptom severity: no Urinary incontinence: no Foul odor: no Hematuria: no Abdominal pain:  no Back pain: yes Suprapubic pain/pressure: yes Flank pain: no Fever:  no Vomiting: no Status: better Previous urinary tract infection: yes Recurrent urinary tract infection: no Sexual activity: monogamous History of sexually transmitted disease: no Treatments attempted: increasing fluids    Relevant past medical, surgical, family and social history reviewed and updated as indicated. Interim medical history since our last visit reviewed. Allergies and medications reviewed and updated.  Review of Systems  Constitutional:  Negative for activity change, appetite change, fatigue and fever.  Respiratory: Negative.    Cardiovascular: Negative.   Gastrointestinal:  Positive for abdominal pain. Negative for abdominal distention, constipation, diarrhea, nausea and vomiting.  Genitourinary:  Positive for decreased urine volume, dysuria, frequency and urgency. Negative for hematuria.  Neurological: Negative.   Psychiatric/Behavioral: Negative.      Per HPI unless specifically indicated above     Objective:    LMP 10/07/2016 (Approximate)   Wt Readings from Last 3 Encounters:  10/04/21 150 lb (68 kg)  08/06/21 155 lb 6.4 oz (70.5 kg)  03/08/21 153 lb 12.8 oz (69.8 kg)    Physical Exam Vitals and nursing note reviewed.  Constitutional:      General: She is awake. She is not in acute distress.    Appearance: She is well-developed. She is not ill-appearing or toxic-appearing.  HENT:     Head: Normocephalic.     Right Ear: Hearing normal.     Left Ear: Hearing normal.  Eyes:     General: Lids are normal.  Right eye: No discharge.        Left eye: No discharge.     Conjunctiva/sclera: Conjunctivae normal.  Pulmonary:     Effort: Pulmonary effort is normal. No accessory muscle usage or respiratory distress.  Musculoskeletal:     Cervical back: Normal range of motion.  Neurological:     Mental Status: She is alert and oriented to person, place, and time.  Psychiatric:         Attention and Perception: Attention normal.        Mood and Affect: Mood normal.        Behavior: Behavior normal. Behavior is cooperative.        Thought Content: Thought content normal.        Judgment: Judgment normal.     Results for orders placed or performed in visit on 11/08/21  Microscopic Examination   Urine  Result Value Ref Range   WBC, UA 6-10 (A) 0 - 5 /hpf   RBC, Urine 3-10 (A) 0 - 2 /hpf   Epithelial Cells (non renal) 0-10 0 - 10 /hpf   Bacteria, UA None seen None seen/Few  Urinalysis, Routine w reflex microscopic  Result Value Ref Range   Specific Gravity, UA 1.015 1.005 - 1.030   pH, UA 7.0 5.0 - 7.5   Color, UA Yellow Yellow   Appearance Ur Clear Clear   Leukocytes,UA 1+ (A) Negative   Protein,UA Negative Negative/Trace   Glucose, UA Negative Negative   Ketones, UA Negative Negative   RBC, UA 3+ (A) Negative   Bilirubin, UA Negative Negative   Urobilinogen, Ur 0.2 0.2 - 1.0 mg/dL   Nitrite, UA Negative Negative   Microscopic Examination See below:       Assessment & Plan:   Problem List Items Addressed This Visit       Genitourinary   UTI (urinary tract infection) - Primary    Acute with hematuria.  Treatment started with Augmentin at this time and urine sent for culture, will adjust regimen as needed if culture shows alternate needed.  Educated patient on this.  Increase fluid intake and may take cranberry supplement as needed.       I discussed the assessment and treatment plan with the patient. The patient was provided an opportunity to ask questions and all were answered. The patient agreed with the plan and demonstrated an understanding of the instructions.   The patient was advised to call back or seek an in-person evaluation if the symptoms worsen or if the condition fails to improve as anticipated.   I provided 21+ minutes of time during this encounter.    Follow up plan: Return if symptoms worsen or fail to improve.

## 2021-11-09 NOTE — Patient Instructions (Signed)
Urinary Tract Infection, Adult A urinary tract infection (UTI) is an infection of any part of the urinary tract. The urinary tract includes: The kidneys. The ureters. The bladder. The urethra. These organs make, store, and get rid of pee (urine) in the body. What are the causes? This infection is caused by germs (bacteria) in your genital area. These germs grow and cause swelling (inflammation) of your urinary tract. What increases the risk? The following factors may make you more likely to develop this condition: Using a small, thin tube (catheter) to drain pee. Not being able to control when you pee or poop (incontinence). Being female. If you are female, these things can increase the risk: Using these methods to prevent pregnancy: A medicine that kills sperm (spermicide). A device that blocks sperm (diaphragm). Having low levels of a female hormone (estrogen). Being pregnant. You are more likely to develop this condition if: You have genes that add to your risk. You are sexually active. You take antibiotic medicines. You have trouble peeing because of: A prostate that is bigger than normal, if you are female. A blockage in the part of your body that drains pee from the bladder. A kidney stone. A nerve condition that affects your bladder. Not getting enough to drink. Not peeing often enough. You have other conditions, such as: Diabetes. A weak disease-fighting system (immune system). Sickle cell disease. Gout. Injury of the spine. What are the signs or symptoms? Symptoms of this condition include: Needing to pee right away. Peeing small amounts often. Pain or burning when peeing. Blood in the pee. Pee that smells bad or not like normal. Trouble peeing. Pee that is cloudy. Fluid coming from the vagina, if you are female. Pain in the belly or lower back. Other symptoms include: Vomiting. Not feeling hungry. Feeling mixed up (confused). This may be the first symptom in  older adults. Being tired and grouchy (irritable). A fever. Watery poop (diarrhea). How is this treated? Taking antibiotic medicine. Taking other medicines. Drinking enough water. In some cases, you may need to see a specialist. Follow these instructions at home:  Medicines Take over-the-counter and prescription medicines only as told by your doctor. If you were prescribed an antibiotic medicine, take it as told by your doctor. Do not stop taking it even if you start to feel better. General instructions Make sure you: Pee until your bladder is empty. Do not hold pee for a long time. Empty your bladder after sex. Wipe from front to back after peeing or pooping if you are a female. Use each tissue one time when you wipe. Drink enough fluid to keep your pee pale yellow. Keep all follow-up visits. Contact a doctor if: You do not get better after 1-2 days. Your symptoms go away and then come back. Get help right away if: You have very bad back pain. You have very bad pain in your lower belly. You have a fever. You have chills. You feeling like you will vomit or you vomit. Summary A urinary tract infection (UTI) is an infection of any part of the urinary tract. This condition is caused by germs in your genital area. There are many risk factors for a UTI. Treatment includes antibiotic medicines. Drink enough fluid to keep your pee pale yellow. This information is not intended to replace advice given to you by your health care provider. Make sure you discuss any questions you have with your health care provider. Document Revised: 09/06/2019 Document Reviewed: 09/06/2019 Elsevier Patient Education    2023 Elsevier Inc.  

## 2021-11-09 NOTE — Assessment & Plan Note (Signed)
Acute with hematuria.  Treatment started with Augmentin at this time and urine sent for culture, will adjust regimen as needed if culture shows alternate needed.  Educated patient on this.  Increase fluid intake and may take cranberry supplement as needed.

## 2021-11-11 ENCOUNTER — Other Ambulatory Visit: Payer: Self-pay | Admitting: Nurse Practitioner

## 2021-11-11 LAB — URINE CULTURE

## 2021-11-11 MED ORDER — NITROFURANTOIN MONOHYD MACRO 100 MG PO CAPS: 100.0000 mg | ORAL_CAPSULE | Freq: Two times a day (BID) | ORAL | 0 refills | Status: AC

## 2021-11-11 NOTE — Progress Notes (Signed)
Contacted via [place] morning Kennie, your urine returned showing some growth of bacteria present.  It is not >100,000 but we could be catching early on or while it is on the way out.  However, I do need to change antibiotic to Macrobid and stop Augmentin as this bacteria is more susceptible to Macrobid.  Any questions on this? Keep being stellar!!  Thank you for allowing me to participate in your care.  I appreciate you. Kindest regards, Mako Pelfrey

## 2022-01-31 NOTE — Patient Instructions (Signed)
Preventing Cervical Cancer Cervical cancer is cancer that grows on the cervix. The cervix is at the bottom of the uterus. It connects the uterus to the vagina. The uterus is where a baby develops during pregnancy. Cancer occurs when cells become abnormal and start to grow out of control. If cervical cancer is not found early, it can spread and become dangerous. Cervical cancer cannot always be prevented, but you can take steps to lower your risk of developing this condition. How can this condition affect me? Cervical cancer grows slowly and may not cause any symptoms at first. Over time, the cancer can grow deep into the cervix tissue and spread to other areas. This may take years, and it may happen without you knowing about it. If it is found early, cervical cancer can be treated effectively. If the cancer has grown deep into your cervix or has spread, it will be more difficult to treat. Most cases of cervical cancer are caused by an STI (sexually transmitted infection) called human papillomavirus (HPV). One way to reduce your risk of cervical cancer is to take steps to avoid infection with the HPV virus. Getting regular Pap tests is also important because this can help identify changes in cells that could lead to cancer. Your chances of getting this disease can also be reduced by making certain lifestyle changes. What can increase my risk? You are more likely to develop this condition if: You have certain things in your sexual history, such as: Having a sexually transmitted viral infection. These include chlamydia and herpes. Having more than one sexual partner, or having sex with someone who has more than one sexual partner. Not using condoms during sex. Having been sexually active before the age of 48. Your mother took a medicine called diethylstilbestrol (DES) while pregnant with you, causing you to be exposed to this medicine before birth. Your mother or sister has had cervical cancer. You are  between the ages of 17-50. You have or have had certain other medical conditions, such as: Previous cancer of the vagina or vulva. A weakened body defense system (immune system). A history of dysplasia of the cervix. You use oral contraceptives, also called birth control pills. You smoke or breathe in secondhand smoke. What actions can I take to prevent cervical cancer? Preventing HPV infection  Ask your health care provider about getting the HPV vaccine. If you are 16 years old or younger, you may need to get this vaccine, which is given in three doses over 6 months. This vaccine protects against the types of HPV that could cause cancer. Limit the number of people you have sex with. Also avoid having sex with people who have had many sex partners. Use a latex condom every time you have sex. Getting Pap tests Get Pap tests regularly, starting at age 60. Talk with your health care provider about how often you need these tests. Having regular Pap tests will help identify changes in cells that could lead to cancer. Steps can then be taken to prevent cancer from developing. Most women who are 49?60 years of age should have a Pap test every 3 years. Most women who are 57?60 years of age should have a Pap test in combination with an HPV test every 5 years. Women with a higher risk of cervical cancer, such as those with a weakened immune system or those who were exposed to DES medicine before birth, may need more frequent testing. Making other lifestyle changes  Do not use any  products that contain nicotine or tobacco, such as cigarettes, e-cigarettes, and chewing tobacco. If you need help quitting, ask your health care provider. Eat a healthy diet that includes at least 5 servings of fruits and vegetables every day. Lose weight if you are overweight. Where to find support Talk with your health care provider, school nurse, or local health department for guidance about screening and  vaccination. Some children and teens may be able to get the HPV vaccine free of charge through the U.S. government's Vaccines for Children [facility]) program. Other places that provide vaccinations include: Public health clinics. Check with your local health department. [place], where you would pay only what you can afford. To find one near you, check this website: http://lyons.com/ [place]. These are part of a program for Medicare and Medicaid patients who live in rural areas. The National Breast and Cervical Cancer Early Detection Program also provides breast and cervical cancer screenings and diagnostic services to low-income, uninsured, and underinsured women. Cervical cancer can be passed down through families. Talk with your health care provider or a genetic counselor to learn more about genetic testing for cancer. Where to find more information Learn more about cervical cancer from: [employer]: www.acog.org American Cancer Society: www.cancer.org Centers for Disease Control and Prevention: http://www.wolf.info/ Contact a health care provider if you have: Pelvic pain. Unusual discharge or bleeding from your vagina. Summary Cervical cancer is cancer that grows on the cervix. The cervix is at the bottom of the uterus. Ask your health care provider about getting the HPV vaccine. Be sure to get regular Pap tests as recommended by your health care provider. See your health care provider right away if you have any pelvic pain or unusual discharge or bleeding from your vagina. This information is not intended to replace advice given to you by your health care provider. Make sure you discuss any questions you have with your health care provider. Document Revised: 08/24/2018 Document Reviewed: 08/27/2018 Elsevier Patient Education  [place].

## 2022-02-04 ENCOUNTER — Ambulatory Visit: Payer: BC Managed Care – PPO | Admitting: Nurse Practitioner

## 2022-02-04 ENCOUNTER — Other Ambulatory Visit (HOSPITAL_COMMUNITY)
Admission: RE | Admit: 2022-02-04 | Discharge: 2022-02-04 | Disposition: A | Discharge status: Home / Self Care | Payer: BC Managed Care – PPO | Source: Ambulatory Visit | Attending: Nurse Practitioner | Admitting: Nurse Practitioner

## 2022-02-04 ENCOUNTER — Encounter: Payer: Self-pay | Admitting: Nurse Practitioner

## 2022-02-04 VITALS — BP 148/98 | HR 83 | Temp 98.0°F | Resp 17 | Ht 65.35 in | Wt 156.0 lb

## 2022-02-04 DIAGNOSIS — I10 Essential (primary) hypertension: Secondary | ICD-10-CM

## 2022-02-04 DIAGNOSIS — Z124 Encounter for screening for malignant neoplasm of cervix: Secondary | ICD-10-CM

## 2022-02-04 DIAGNOSIS — E7800 Pure hypercholesterolemia, unspecified: Secondary | ICD-10-CM

## 2022-02-04 DIAGNOSIS — R87619 Unspecified abnormal cytological findings in specimens from cervix uteri: Secondary | ICD-10-CM | POA: Insufficient documentation

## 2022-02-04 DIAGNOSIS — Z6826 Body mass index (BMI) 26.0-26.9, adult: Secondary | ICD-10-CM

## 2022-02-04 MED ORDER — LISINOPRIL-HYDROCHLOROTHIAZIDE 20-12.5 MG PO TABS: 1.0000 | ORAL_TABLET | Freq: Every day | ORAL | 4 refills | Status: DC

## 2022-02-04 NOTE — Assessment & Plan Note (Signed)
Noted on past labs with LDL <190 and ASCVD 7.6% -- may benefit from statin upcoming, but now continue diet and exercise focus.  No history of MI or CVA.  At this time recheck lipid panel.

## 2022-02-04 NOTE — Assessment & Plan Note (Signed)
Pap obtained today and will send to GYN if abnormal.

## 2022-02-04 NOTE — Assessment & Plan Note (Signed)
Chronic, ongoing with BP above goal on initial and recheck today.  Will restart HCTZ and monitor NA+ closely as this worked well in past for BP in combo with Lisinopril.  If NA+ drops will stop HCTZ and go up on Lisinopril, could also consider Amlodipine in future if needed.  Recommend she monitor BP at home at least 3 mornings a week and document + focus on DASH diet.  LABS: BMP.  Return in 4 weeks.

## 2022-02-04 NOTE — Assessment & Plan Note (Signed)
Recommended eating smaller high protein, low fat meals more frequently and exercising 30 mins a day 5 times a week with a goal of 10-15lb weight loss in the next 3 months. Patient voiced their understanding and motivation to adhere to these recommendations.  

## 2022-02-04 NOTE — Progress Notes (Signed)
BP (!) 148/98 (BP Location: Left Arm, Patient Position: Sitting, Cuff Size: Normal)   Pulse 83   Temp 98 F (36.7 C) (Oral)   Resp 17   Ht 5' 5.35" (1.66 m)   Wt 156 lb (70.8 kg)   LMP 10/07/2016 (Approximate)   SpO2 98%   BMI 25.68 kg/m    Subjective:    Patient ID: Alicia Copeland, female    DOB: 05-01-1960, 61 y.o.   MRN: [id number]  HPI: Alicia Copeland is a 61 y.o. female  Chief Complaint  Patient presents with   Hyperlipidemia   Hypertension   Gynecologic Exam   Due for pap smear. Performed today.   HYPERTENSION / HYPERLIPIDEMIA Continues on Lisinopril 20 MG daily.  Took HCTZ in past but NA+ became lower. Satisfied with current treatment? yes Duration of hypertension: chronic BP monitoring frequency: not checking BP range:  BP medication side effects: no Duration of hyperlipidemia: chronic Medication compliance: good compliance Aspirin: no Recent stressors: no Recurrent headaches: no Visual changes: no Palpitations: no Dyspnea: no Chest pain: no Lower extremity edema: no Dizzy/lightheaded: no  The 10-year ASCVD risk score (Arnett DK, et al., 2019) is: 7.6%   Values used to calculate the score:     Age: 45 years     Sex: Female     Is Non-Hispanic African American: No     Diabetic: No     Tobacco smoker: No     Systolic Blood Pressure: 270 mmHg     Is BP treated: Yes     HDL Cholesterol: 46 mg/dL     Total Cholesterol: 240 mg/dL  Relevant past medical, surgical, family and social history reviewed and updated as indicated. Interim medical history since our last visit reviewed. Allergies and medications reviewed and updated.  Review of Systems  Constitutional:  Negative for activity change, appetite change, diaphoresis, fatigue and fever.  Respiratory:  Negative for cough, chest tightness and shortness of breath.   Cardiovascular:  Negative for chest pain, palpitations and leg swelling.  Gastrointestinal: Negative.   Neurological: Negative.    Psychiatric/Behavioral: Negative.     Per HPI unless specifically indicated above     Objective:    BP (!) 148/98 (BP Location: Left Arm, Patient Position: Sitting, Cuff Size: Normal)   Pulse 83   Temp 98 F (36.7 C) (Oral)   Resp 17   Ht 5' 5.35" (1.66 m)   Wt 156 lb (70.8 kg)   LMP 10/07/2016 (Approximate)   SpO2 98%   BMI 25.68 kg/m   Wt Readings from Last 3 Encounters:  02/04/22 156 lb (70.8 kg)  10/04/21 150 lb (68 kg)  08/06/21 155 lb 6.4 oz (70.5 kg)    Physical Exam Vitals and nursing note reviewed. Exam conducted with a chaperone present.  Constitutional:      General: She is awake. She is not in acute distress.    Appearance: She is well-developed. She is not ill-appearing.  HENT:     Head: Normocephalic and atraumatic.     Right Ear: Hearing, ear canal and external ear normal.     Left Ear: Hearing, ear canal and external ear normal.  Eyes:     General: Lids are normal.        Right eye: No discharge.        Left eye: No discharge.     Conjunctiva/sclera: Conjunctivae normal.     Pupils: Pupils are equal, round, and reactive to light.  Neck:  Thyroid: No thyromegaly.     Vascular: No carotid bruit.     Trachea: Trachea normal.  Cardiovascular:     Rate and Rhythm: Normal rate and regular rhythm.     Heart sounds: Normal heart sounds. No murmur heard.    No gallop.  Pulmonary:     Effort: Pulmonary effort is normal. No accessory muscle usage or respiratory distress.     Breath sounds: Normal breath sounds.  Abdominal:     General: Bowel sounds are normal. There is no distension.     Palpations: Abdomen is soft.     Tenderness: There is no abdominal tenderness.     Hernia: There is no hernia in the left inguinal area or right inguinal area.  Genitourinary:    Exam position: Lithotomy position.     Labia:        Right: No rash.        Left: No rash.      Urethra: No prolapse.     Vagina: Normal.     Cervix: Friability present. No discharge.      Uterus: Normal.      Adnexa: Right adnexa normal and left adnexa normal.     Comments: Cervix slightly posterior and viewed, mild erythema around os and friability present.  Pap obtained and sent. Musculoskeletal:        General: Normal range of motion.     Cervical back: Normal range of motion and neck supple.     Right lower leg: No edema.     Left lower leg: No edema.  Lymphadenopathy:     Cervical: No cervical adenopathy.  Skin:    General: Skin is warm and dry.     Capillary Refill: Capillary refill takes less than 2 seconds.     Findings: No rash.  Neurological:     Mental Status: She is alert and oriented to person, place, and time.     Gait: Gait is intact.     Deep Tendon Reflexes: Reflexes are normal and symmetric.     Reflex Scores:      Brachioradialis reflexes are 2+ on the right side and 2+ on the left side.      Patellar reflexes are 2+ on the right side and 2+ on the left side. Psychiatric:        Attention and Perception: Attention normal.        Mood and Affect: Mood normal.        Speech: Speech normal.        Behavior: Behavior normal. Behavior is cooperative.        Thought Content: Thought content normal.        Judgment: Judgment normal.    Results for orders placed or performed in visit on 11/08/21  Urine Culture   Specimen: Urine   UR  Result Value Ref Range   Urine Culture, Routine Final report (A)    Organism ID, Bacteria Comment (A)    ORGANISM ID, BACTERIA Comment       Assessment & Plan:   Problem List Items Addressed This Visit       Cardiovascular and Mediastinum   Hypertension - Primary    Chronic, ongoing with BP above goal on initial and recheck today.  Will restart HCTZ and monitor NA+ closely as this worked well in past for BP in combo with Lisinopril.  If NA+ drops will stop HCTZ and go up on Lisinopril, could also consider Amlodipine in future if needed.  Recommend she monitor BP at home at least 3 mornings a week and document +  focus on DASH diet.  LABS: BMP.  Return in 4 weeks.      Relevant Medications   lisinopril-hydrochlorothiazide (ZESTORETIC) 20-12.5 MG tablet   Other Relevant Orders   Basic metabolic panel     Other   Atypical glandular cells of undetermined significance (AGUS) on cervical Pap smear    Pap obtained today and will send to GYN if abnormal.      Relevant Orders   Cytology - PAP   BMI 26.0-26.9,adult    Recommended eating smaller high protein, low fat meals more frequently and exercising 30 mins a day 5 times a week with a goal of 10-15lb weight loss in the next 3 months. Patient voiced their understanding and motivation to adhere to these recommendations.       Elevated low density lipoprotein (LDL) cholesterol level    Noted on past labs with LDL <190 and ASCVD 7.6% -- may benefit from statin upcoming, but now continue diet and exercise focus.  No history of MI or CVA.  At this time recheck lipid panel.      Relevant Orders   Lipid Panel w/o Chol/HDL Ratio   Other Visit Diagnoses     Cervical cancer screening       Pap due and obtained today, discussed with patient.   Relevant Orders   Cytology - PAP        Follow up plan: Return in about 4 weeks (around 03/04/2022) for HTN -- changed to [place].

## 2022-02-05 LAB — BASIC METABOLIC PANEL
BUN/Creatinine Ratio: 16 (ref 12–28)
BUN: 12 mg/dL (ref 8–27)
CO2: 24 mmol/L (ref 20–29)
Calcium: 10.1 mg/dL (ref 8.7–10.3)
Chloride: 100 mmol/L (ref 96–106)
Creatinine, Ser: 0.77 mg/dL (ref 0.57–1.00)
Glucose: 86 mg/dL (ref 70–99)
Potassium: 4.2 mmol/L (ref 3.5–5.2)
Sodium: 141 mmol/L (ref 134–144)
eGFR: 88 mL/min/{1.73_m2} (ref >59)

## 2022-02-05 LAB — LIPID PANEL W/O CHOL/HDL RATIO
Cholesterol, Total: 244 mg/dL — ABNORMAL HIGH (ref 100–199)
HDL: 44 mg/dL (ref >39)
LDL Chol Calc (NIH): 154 mg/dL — ABNORMAL HIGH (ref 0–99)
Triglycerides: 250 mg/dL — ABNORMAL HIGH (ref 0–149)
VLDL Cholesterol Cal: 46 mg/dL — ABNORMAL HIGH (ref 5–40)

## 2022-02-05 NOTE — Progress Notes (Signed)
Contacted via MyChart The 10-year ASCVD risk score (Arnett DK, et al., 2019) is: 7.9%   Values used to calculate the score:     Age: 61 years     Sex: Female     Is Non-Hispanic African American: No     Diabetic: No     Tobacco smoker: No     Systolic Blood Pressure: 639 mmHg     Is BP treated: Yes     HDL Cholesterol: 44 mg/dL     Total Cholesterol: 244 mg/dL   Good evening Nicolet, your labs have returned: - Kidney function, creatinine and eGFR, remains stable. - Cholesterol levels remain elevated we will discuss more options next visit.  Any questions? Keep being amazing!!  Thank you for allowing me to participate in your care.  I appreciate you. Kindest regards, Horatio Bertz

## 2022-02-15 LAB — CYTOLOGY - PAP
Comment: NEGATIVE
Diagnosis: NEGATIVE
Diagnosis: REACTIVE
High risk HPV: NEGATIVE

## 2022-02-15 NOTE — Progress Notes (Signed)
Contacted via [place] afternoon Amber, your pap returned and is all normal.  Negative HPV and cytology.  There are some atrophic changes as we discussed.  Any questions?

## 2022-02-27 NOTE — Patient Instructions (Signed)

## 2022-03-04 ENCOUNTER — Ambulatory Visit: Payer: BC Managed Care – PPO | Admitting: Nurse Practitioner

## 2022-03-04 ENCOUNTER — Encounter: Payer: Self-pay | Admitting: Nurse Practitioner

## 2022-03-04 VITALS — BP 134/80 | HR 80 | Temp 97.9°F | Ht 65.59 in | Wt 153.9 lb

## 2022-03-04 DIAGNOSIS — I10 Essential (primary) hypertension: Secondary | ICD-10-CM | POA: Diagnosis not present

## 2022-03-04 NOTE — Progress Notes (Signed)
BP 134/80 (BP Location: Left Arm, Patient Position: Sitting, Cuff Size: Normal)   Pulse 80   Temp 97.9 F (36.6 C) (Oral)   Ht 5' 5.59" (1.666 m)   Wt 153 lb 14.4 oz (69.8 kg)   LMP 10/07/2016 (Approximate)   SpO2 96%   BMI 25.15 kg/m    Subjective:    Patient ID: Alicia Copeland, female    DOB: 25-Nov-1960, 62 y.o.   MRN: [id number]  HPI: Alicia Copeland is a 62 y.o. female  Chief Complaint  Patient presents with   Hypertension    Changed htn med to Lisinopril at last visit   HYPERTENSION / HYPERLIPIDEMIA Continues on Lisinopril 20 MG daily, restarted HCTZ last visit to assess.  Is tolerating this well, but did not notice initial BP lower.  Took HCTZ in past but NA+ became lower.  She has been cutting back on soft drinks. Satisfied with current treatment? yes Duration of hypertension: chronic BP monitoring frequency: not often BP range:  BP medication side effects: no Duration of hyperlipidemia: chronic Medication compliance: good compliance Aspirin: no Recent stressors: no Recurrent headaches: no Visual changes: no Palpitations: no Dyspnea: no Chest pain: no Lower extremity edema: no Dizzy/lightheaded: no  The 10-year ASCVD risk score (Arnett DK, et al., 2019) is: 6.5%   Values used to calculate the score:     Age: 69 years     Sex: Female     Is Non-Hispanic African American: No     Diabetic: No     Tobacco smoker: No     Systolic Blood Pressure: 163 mmHg     Is BP treated: Yes     HDL Cholesterol: 44 mg/dL     Total Cholesterol: 244 mg/dL  Relevant past medical, surgical, family and social history reviewed and updated as indicated. Interim medical history since our last visit reviewed. Allergies and medications reviewed and updated.  Review of Systems  Constitutional:  Negative for activity change, appetite change, diaphoresis, fatigue and fever.  Respiratory:  Negative for cough, chest tightness and shortness of breath.   Cardiovascular:  Negative  for chest pain, palpitations and leg swelling.  Gastrointestinal: Negative.   Neurological: Negative.   Psychiatric/Behavioral: Negative.     Per HPI unless specifically indicated above     Objective:    BP 134/80 (BP Location: Left Arm, Patient Position: Sitting, Cuff Size: Normal)   Pulse 80   Temp 97.9 F (36.6 C) (Oral)   Ht 5' 5.59" (1.666 m)   Wt 153 lb 14.4 oz (69.8 kg)   LMP 10/07/2016 (Approximate)   SpO2 96%   BMI 25.15 kg/m   Wt Readings from Last 3 Encounters:  03/04/22 153 lb 14.4 oz (69.8 kg)  02/04/22 156 lb (70.8 kg)  10/04/21 150 lb (68 kg)    Physical Exam Vitals and nursing note reviewed.  Constitutional:      General: She is awake. She is not in acute distress.    Appearance: She is well-developed. She is not ill-appearing.  HENT:     Head: Normocephalic and atraumatic.     Right Ear: Hearing, ear canal and external ear normal.     Left Ear: Hearing, ear canal and external ear normal.  Eyes:     General: Lids are normal.        Right eye: No discharge.        Left eye: No discharge.     Conjunctiva/sclera: Conjunctivae normal.     Pupils: Pupils  are equal, round, and reactive to light.  Neck:     Thyroid: No thyromegaly.     Vascular: No carotid bruit.     Trachea: Trachea normal.  Cardiovascular:     Rate and Rhythm: Normal rate and regular rhythm.     Heart sounds: Normal heart sounds. No murmur heard.    No gallop.  Pulmonary:     Effort: Pulmonary effort is normal. No accessory muscle usage or respiratory distress.     Breath sounds: Normal breath sounds.  Abdominal:     General: Bowel sounds are normal. There is no distension.     Palpations: Abdomen is soft.     Tenderness: There is no abdominal tenderness.  Genitourinary:    Adnexa: Right adnexa normal and left adnexa normal.  Musculoskeletal:        General: Normal range of motion.     Cervical back: Normal range of motion and neck supple.     Right lower leg: No edema.     Left  lower leg: No edema.  Lymphadenopathy:     Cervical: No cervical adenopathy.  Skin:    General: Skin is warm and dry.     Capillary Refill: Capillary refill takes less than 2 seconds.     Findings: No rash.  Neurological:     Mental Status: She is alert and oriented to person, place, and time.     Gait: Gait is intact.     Deep Tendon Reflexes: Reflexes are normal and symmetric.     Reflex Scores:      Brachioradialis reflexes are 2+ on the right side and 2+ on the left side.      Patellar reflexes are 2+ on the right side and 2+ on the left side. Psychiatric:        Attention and Perception: Attention normal.        Mood and Affect: Mood normal.        Speech: Speech normal.        Behavior: Behavior normal. Behavior is cooperative.        Thought Content: Thought content normal.        Judgment: Judgment normal.    Results for orders placed or performed in visit on 29/47/65  Basic metabolic panel  Result Value Ref Range   Glucose 86 70 - 99 mg/dL   BUN 12 8 - 27 mg/dL   Creatinine, Ser 0.77 0.57 - 1.00 mg/dL   eGFR 88 >59 mL/min/1.73   BUN/Creatinine Ratio 16 12 - 28   Sodium 141 134 - 144 mmol/L   Potassium 4.2 3.5 - 5.2 mmol/L   Chloride 100 96 - 106 mmol/L   CO2 24 20 - 29 mmol/L   Calcium 10.1 8.7 - 10.3 mg/dL  Lipid Panel w/o Chol/HDL Ratio  Result Value Ref Range   Cholesterol, Total 244 (H) 100 - 199 mg/dL   Triglycerides 250 (H) 0 - 149 mg/dL   HDL 44 >39 mg/dL   VLDL Cholesterol Cal 46 (H) 5 - 40 mg/dL   LDL Chol Calc (NIH) 154 (H) 0 - 99 mg/dL  Cytology - PAP  Result Value Ref Range   High risk HPV Negative    Adequacy      Satisfactory for evaluation; transformation zone component PRESENT.   Diagnosis      - Negative for Intraepithelial Lesions or Malignancy (NILM)   Diagnosis - Benign reactive/reparative changes    Comment Atrophic changes are present.    Comment  Normal Reference Range HPV - Negative       Assessment & Plan:   Problem List Items  Addressed This Visit       Cardiovascular and Mediastinum   Hypertension - Primary    Chronic, ongoing with BP improving with addition of HCTZ.  Will check BMP today to ensure she is tolerating and no drop in NA+.  If NA+ drops will stop HCTZ and go up on Lisinopril, could also consider Amlodipine in future if needed.  Recommend she monitor BP at home at least 3 mornings a week and document + focus on DASH diet.  LABS: BMP.        Relevant Orders   Basic metabolic panel     Follow up plan: Return in about 5 months (around 08/03/2022) for HTN/HLD.

## 2022-03-04 NOTE — Assessment & Plan Note (Addendum)
Chronic, ongoing with BP improving with addition of HCTZ.  Will check BMP today to ensure she is tolerating and no drop in NA+.  If NA+ drops will stop HCTZ and go up on Lisinopril, could also consider Amlodipine in future if needed.  Recommend she monitor BP at home at least 3 mornings a week and document + focus on DASH diet.  LABS: BMP.

## 2022-03-05 ENCOUNTER — Encounter: Payer: Self-pay | Admitting: Nurse Practitioner

## 2022-03-05 LAB — BASIC METABOLIC PANEL
BUN/Creatinine Ratio: 20 (ref 12–28)
BUN: 16 mg/dL (ref 8–27)
CO2: 27 mmol/L (ref 20–29)
Calcium: 10 mg/dL (ref 8.7–10.3)
Chloride: 102 mmol/L (ref 96–106)
Creatinine, Ser: 0.81 mg/dL (ref 0.57–1.00)
Glucose: 99 mg/dL (ref 70–99)
Potassium: 3.9 mmol/L (ref 3.5–5.2)
Sodium: 141 mmol/L (ref 134–144)
eGFR: 83 mL/min/{1.73_m2} (ref >59)

## 2022-03-05 NOTE — Progress Notes (Signed)
Contacted via [place] afternoon Freja, your sodium level is remaining normal.  No medication changes needed at this time.  Any questions?

## 2022-03-07 MED ORDER — LISINOPRIL-HYDROCHLOROTHIAZIDE 20-12.5 MG PO TABS
1.0000 | ORAL_TABLET | Freq: Every day | ORAL | 4 refills | Status: DC
Start: 2022-03-07 — End: 2023-06-02

## 2022-05-02 ENCOUNTER — Telehealth: Payer: Self-pay

## 2022-05-02 NOTE — Telephone Encounter (Signed)
Sent to [place] by accident  Copied from [place] [id number]. Topic: General - Other >> May 02, 2022  4:17 PM [place] B wrote: Reason for CRM: Patient called in asking to speak with Catina about looking into an Solicitor for her.

## 2022-05-09 ENCOUNTER — Telehealth: Payer: Self-pay | Admitting: Nurse Practitioner

## 2022-05-09 NOTE — Telephone Encounter (Signed)
Copied from [place] [id number]. Topic: General - Other >> May 09, 2022 11:17 AM Cyndi Bender wrote: Reason for CRM: Colletta Maryland with [employer] called in with the patient and stated that last week they spoke with Alwyn Ren about the billing for the colonoscopy. Colletta [place] requested to speak with Alwyn Ren for an update but when I attempted to reach [place] on Teams it showed that she was away. Colletta [place] requests that Alwyn Ren return the call to the patient at [phone].

## 2022-05-13 NOTE — Telephone Encounter (Signed)
Contacted patient regarding request to have coding reviewed for colonoscopy. Informed patient that I spoke with [employer] and [facility] and was advised that the diagnosis codes cannot be changed.  Patient stated that she would review her results and call back if she has further questions.

## 2022-05-19 ENCOUNTER — Telehealth: Payer: Self-pay

## 2022-05-19 NOTE — Telephone Encounter (Signed)
Spoke with Ms. Baldonado regarding bill received from her latest colonoscopy procedure. Informed patient that I spoke with Lovina Reach and [facility] as she requested and was informed that the diagnosis codes for the colonoscopy cannot be changed. Patient stated that she will continue to pursue a diagnosis correction and will stop by office to obtain a copy of her colonoscopy performed in 2012.  Advised patient to stop by office to fill out medical release form. Patient acknowledged understanding.

## 2022-08-03 ENCOUNTER — Ambulatory Visit: Payer: BC Managed Care – PPO | Admitting: Nurse Practitioner

## 2022-08-03 DIAGNOSIS — Z6826 Body mass index (BMI) 26.0-26.9, adult: Secondary | ICD-10-CM

## 2022-08-03 DIAGNOSIS — I10 Essential (primary) hypertension: Secondary | ICD-10-CM

## 2022-08-03 DIAGNOSIS — E7800 Pure hypercholesterolemia, unspecified: Secondary | ICD-10-CM

## 2022-08-03 DIAGNOSIS — Z1231 Encounter for screening mammogram for malignant neoplasm of breast: Secondary | ICD-10-CM

## 2022-08-14 NOTE — Patient Instructions (Signed)
Please call to schedule your mammogram and/or bone density: [facility] at [facility]  Address: [address], [place], [place] [address] Phone: [phone]  [facility] [place] [address] [place],  [place]  [address] Phone: [phone]    DASH Eating Plan DASH stands for Dietary Approaches to Stop Hypertension. The DASH eating plan is a healthy eating plan that has been shown to: Lower high blood pressure (hypertension). Reduce your risk for type 2 diabetes, heart disease, and stroke. Help with weight loss. What are tips for following this plan? Reading food labels Check food labels for the amount of salt (sodium) per serving. Choose foods with less than 5 percent of the Daily Value (DV) of sodium. In general, foods with less than 300 milligrams (mg) of sodium per serving fit into this eating plan. To find whole grains, look for the word "whole" as the first word in the ingredient list. Shopping Buy products labeled as "low-sodium" or "no salt added." Buy fresh foods. Avoid canned foods and pre-made or frozen meals. Cooking Try not to add salt when you cook. Use salt-free seasonings or herbs instead of table salt or sea salt. Check with your health care provider or pharmacist before using salt substitutes. Do not fry foods. Cook foods in healthy ways, such as baking, boiling, grilling, roasting, or broiling. Cook using oils that are good for your heart. These include olive, canola, avocado, soybean, and sunflower oil. Meal planning  Eat a balanced diet. This should include: 4 or more servings of fruits and 4 or more servings of vegetables each day. Try to fill half of your plate with fruits and vegetables. 6-8 servings of whole grains each day. 6 or less servings of lean meat, poultry, or fish each day. 1 oz is 1 serving. A 3 oz (85 g) serving of meat is about the same size as the palm of your hand. One egg is 1 oz (28  g). 2-3 servings of low-fat dairy each day. One serving is 1 cup (237 mL). 1 serving of nuts, seeds, or beans 5 times each week. 2-3 servings of heart-healthy fats. Healthy fats called omega-3 fatty acids are found in foods such as walnuts, flaxseeds, fortified milks, and eggs. These fats are also found in cold-water fish, such as sardines, salmon, and mackerel. Limit how much you eat of: Canned or prepackaged foods. Food that is high in trans fat, such as fried foods. Food that is high in saturated fat, such as fatty meat. Desserts and other sweets, sugary drinks, and other foods with added sugar. Full-fat dairy products. Do not salt foods before eating. Do not eat more than 4 egg yolks a week. Try to eat at least 2 vegetarian meals a week. Eat more home-cooked food and less restaurant, buffet, and fast food. Lifestyle When eating at a restaurant, ask if your food can be made with less salt or no salt. If you drink alcohol: Limit how much you have to: 0-1 drink a day if you are female. 0-2 drinks a day if you are female. Know how much alcohol is in your drink. In the [place] one drink is one 12 oz bottle of beer (355 mL), one 5 oz glass of wine (148 mL), or one 1 oz glass of hard liquor (44 mL). General information Avoid eating more than 2,300 mg of salt a day. If you have hypertension, you may need to reduce your sodium intake to 1,500 mg   a day. Work with your provider to stay at a healthy body weight or lose weight. Ask what the best weight range is for you. On most days of the week, get at least 30 minutes of exercise that causes your heart to beat faster. This may include walking, swimming, or biking. Work with your provider or dietitian to adjust your eating plan to meet your specific calorie needs. What foods should I eat? Fruits All fresh, dried, or frozen fruit. Canned fruits that are in their natural juice and do not have sugar added to them. Vegetables Fresh or frozen vegetables  that are raw, steamed, roasted, or grilled. Low-sodium or reduced-sodium tomato and vegetable juice. Low-sodium or reduced-sodium tomato sauce and tomato paste. Low-sodium or reduced-sodium canned vegetables. Grains Whole-grain or whole-wheat bread. Whole-grain or whole-wheat pasta. Brown rice. Oatmeal. Quinoa. Bulgur. Whole-grain and low-sodium cereals. Pita bread. Low-fat, low-sodium crackers. Whole-wheat flour tortillas. Meats and other proteins Skinless chicken or turkey. Ground chicken or turkey. Pork with fat trimmed off. Fish and seafood. Egg whites. Dried beans, peas, or lentils. Unsalted nuts, nut butters, and seeds. Unsalted canned beans. Lean cuts of beef with fat trimmed off. Low-sodium, lean precooked or cured meat, such as sausages or meat loaves. Dairy Low-fat (1%) or fat-free (skim) milk. Reduced-fat, low-fat, or fat-free cheeses. Nonfat, low-sodium ricotta or cottage cheese. Low-fat or nonfat yogurt. Low-fat, low-sodium cheese. Fats and oils Soft margarine without trans fats. Vegetable oil. Reduced-fat, low-fat, or light mayonnaise and salad dressings (reduced-sodium). Canola, safflower, olive, avocado, soybean, and sunflower oils. Avocado. Seasonings and condiments Herbs. Spices. Seasoning mixes without salt. Other foods Unsalted popcorn and pretzels. Fat-free sweets. The items listed above may not be all the foods and drinks you can have. Talk to a dietitian to learn more. What foods should I avoid? Fruits Canned fruit in a light or heavy syrup. Fried fruit. Fruit in cream or butter sauce. Vegetables Creamed or fried vegetables. Vegetables in a cheese sauce. Regular canned vegetables that are not marked as low-sodium or reduced-sodium. Regular canned tomato sauce and paste that are not marked as low-sodium or reduced-sodium. Regular tomato and vegetable juices that are not marked as low-sodium or reduced-sodium. Pickles. Olives. Grains Baked goods made with fat, such as  croissants, muffins, or some breads. Dry pasta or rice meal packs. Meats and other proteins Fatty cuts of meat. Ribs. Fried meat. Bacon. Bologna, salami, and other precooked or cured meats, such as sausages or meat loaves, that are not lean and low in sodium. Fat from the back of a pig (fatback). Bratwurst. Salted nuts and seeds. Canned beans with added salt. Canned or smoked fish. Whole eggs or egg yolks. Chicken or turkey with skin. Dairy Whole or 2% milk, cream, and half-and-half. Whole or full-fat cream cheese. Whole-fat or sweetened yogurt. Full-fat cheese. Nondairy creamers. Whipped toppings. Processed cheese and cheese spreads. Fats and oils Butter. Stick margarine. Lard. Shortening. Ghee. Bacon fat. Tropical oils, such as coconut, palm kernel, or palm oil. Seasonings and condiments Onion salt, garlic salt, seasoned salt, table salt, and sea salt. Worcestershire sauce. Tartar sauce. Barbecue sauce. Teriyaki sauce. Soy sauce, including reduced-sodium soy sauce. Steak sauce. Canned and packaged gravies. Fish sauce. Oyster sauce. Cocktail sauce. Store-bought horseradish. Ketchup. Mustard. Meat flavorings and tenderizers. Bouillon cubes. Hot sauces. Pre-made or packaged marinades. Pre-made or packaged taco seasonings. Relishes. Regular salad dressings. Other foods Salted popcorn and pretzels. The items listed above may not be all the foods and drinks you should avoid. Talk to a   dietitian to learn more. Where to find more information National Heart, Lung, and Blood Institute (NHLBI): nhlbi.nih.gov American Heart Association (AHA): heart.org Academy of Nutrition and Dietetics: eatright.org National Kidney Foundation (NKF): kidney.org This information is not intended to replace advice given to you by your health care provider. Make sure you discuss any questions you have with your health care provider. Document Revised: 02/10/2022 Document Reviewed: 02/10/2022 Elsevier Patient Education  2024  Elsevier Inc.  

## 2022-08-15 ENCOUNTER — Ambulatory Visit: Payer: Self-pay | Admitting: *Deleted

## 2022-08-15 NOTE — Telephone Encounter (Signed)
  Chief Complaint: rectal bleeding- blood clots seen today-x2 Symptoms: increased pressure, gas Frequency: started  today Pertinent Negatives: Patient denies constipation- but has been have a lot of pressure, gas, some abdominal discomfort Disposition: [] ED /[] Urgent Care (no appt availability in office) / [] Appointment(In office/virtual)/ []  [facility]/ [] Home Care/ [] Refused Recommended Disposition /[] [facility]/ [x]  Follow-up with PCP Additional Notes: Patient has appointment Wednesday- she does not want to wait0- is requesting appointment today- her father's birthday is tomorrow and needs to see him. Office is closed for lunch and unable to call [facility]- will send message for PCP review. Patient states she will wait- but not sure she should.

## 2022-08-15 NOTE — Telephone Encounter (Signed)
Reason for Disposition  MODERATE rectal bleeding (small blood clots, passing blood without stool, or toilet water turns red)  Answer Assessment - Initial Assessment Questions 1. APPEARANCE of BLOOD: "What color is it?" "Is it passed separately, on the surface of the stool, or mixed in with the stool?"      Blood clot- 2 times this morning 2. AMOUNT: "How much blood was passed?"      A little bit in under wear- but no separate blood 3. FREQUENCY: "How many times has blood been passed with the stools?"      Twice today 4. ONSET: "When was the blood first seen in the stools?" (Days or weeks)      This morning- over last 2 weeks- digestive upset- few days of loose stools- fells pressure to go- mucus seen- feels gas pressure- LL area- hx diverticulosis/polyps. Last night felt gas pressure in lower GI- 11:00- sat for long time- sweated, nausea, pain, pushed- had BM- went to bed- still had pressure- this morning- when wipes btight red blood 5. DIARRHEA: "Is there also some diarrhea?" If Yes, ask: "How many diarrhea stools in the past 24 hours?"      Loose stool- feeling pressure 6. CONSTIPATION: "Do you have constipation?" If Yes, ask: "How bad is it?"     no 7. RECURRENT SYMPTOMS: "Have you had blood in your stools before?" If Yes, ask: "When was the last time?" and "What happened that time?"      no 8. BLOOD THINNERS: "Do you take any blood thinners?" (e.g., Coumadin/warfarin, Pradaxa/dabigatran, aspirin)     no 9. OTHER SYMPTOMS: "Do you have any other symptoms?"  (e.g., abdomen pain, vomiting, dizziness, fever)     Gas  Protocols used: Rectal Bleeding-A-AH

## 2022-08-16 NOTE — Telephone Encounter (Signed)
Attempted to reach patient to notify her that her provider, stated that she had the appointment on 08/17/2022 and that if her symptoms worsen that the provider recommends her to go to ER and UC.

## 2022-08-17 ENCOUNTER — Encounter: Payer: Self-pay | Admitting: Nurse Practitioner

## 2022-08-17 ENCOUNTER — Ambulatory Visit: Payer: BC Managed Care – PPO | Admitting: Nurse Practitioner

## 2022-08-17 VITALS — BP 121/81 | HR 89 | Temp 98.5°F | Ht 65.59 in | Wt 145.8 lb

## 2022-08-17 DIAGNOSIS — Z6826 Body mass index (BMI) 26.0-26.9, adult: Secondary | ICD-10-CM

## 2022-08-17 DIAGNOSIS — I10 Essential (primary) hypertension: Secondary | ICD-10-CM

## 2022-08-17 DIAGNOSIS — Z1231 Encounter for screening mammogram for malignant neoplasm of breast: Secondary | ICD-10-CM | POA: Diagnosis not present

## 2022-08-17 DIAGNOSIS — E7800 Pure hypercholesterolemia, unspecified: Secondary | ICD-10-CM

## 2022-08-17 DIAGNOSIS — K625 Hemorrhage of anus and rectum: Secondary | ICD-10-CM | POA: Diagnosis not present

## 2022-08-17 NOTE — Progress Notes (Signed)
BP 121/81   Pulse 89   Temp 98.5 F (36.9 C) (Oral)   Ht 5' 5.59" (1.666 m)   Wt 145 lb 12.8 oz (66.1 kg)   LMP 10/07/2016 (Approximate)   SpO2 98%   BMI 23.83 kg/m    Subjective:    Patient ID: Alicia Copeland, female    DOB: [birth year], 61 y.o.   MRN: [id number]  HPI: Alicia Copeland is a 62 y.o. female  Chief Complaint  Patient presents with   Hypertension   Hyperlipidemia   HYPERTENSION / HYPERLIPIDEMIA Continues on Lisinopril-hydrochlorothiazide 20-12.5 MG.  Took HCTZ in past but NA+ became lower.  Has been cutting back on soft drinks. Satisfied with current treatment? yes Duration of hypertension: chronic BP monitoring frequency: not often BP range:  BP medication side effects: no Duration of hyperlipidemia: chronic Medication compliance: good compliance Aspirin: no Recent stressors: no Recurrent headaches: no Visual changes: no Palpitations: no Dyspnea: no Chest pain: no Lower extremity edema: no Dizzy/lightheaded: no  The 10-year ASCVD risk score (Arnett DK, et al., 2019) is: 5.8%   Values used to calculate the score:     Age: 45 years     Sex: Female     Is Non-Hispanic African American: No     Diabetic: No     Tobacco smoker: No     Systolic Blood Pressure: 121 mmHg     Is BP treated: Yes     HDL Cholesterol: 44 mg/dL     Total Cholesterol: 244 mg/dL  RECTAL BLEEDING Duration: started 08/15/22 -- Sunday night had cramping and GI discomfort, went to bathroom and she started sweating/nausea -- then next morning had bowel movement and blood in toilet noticed with clots.  No pain at this time, just cramping.  Then on Tuesday morning had regular BM and no blood noted, stool was brown.  Last colonoscopy on 10/04/21 with diverticulosis noted and polyp removal x 3.   Bright red rectal bleeding: yes in toilet, on tissue pink, every time she went there was less blood Amount of blood: moderate  Frequency: as above Melena: no  Spotting on toilet tissue: yes   Anal fullness: no  Perianal pain: no  Severity: none Perianal irritation/itching: no  Constipation: yes  Chronic straining/valsava:  sometimes  Anal trauma/intercourse: no  Hemorrhoids: on colonoscopy in 2012  Previous colonoscopy: yes       08/17/2022    2:16 PM 08/06/2021    1:47 PM 02/03/2021   10:33 AM 08/04/2020   10:06 AM 02/04/2020    9:41 AM  Depression screen PHQ 2/9  Decreased Interest 0 0 0 0 0  Down, Depressed, Hopeless 0 0 0 0 0  PHQ - 2 Score 0 0 0 0 0  Altered sleeping 0 0 0    Tired, decreased energy 0 0 0    Change in appetite 0 0 0    Feeling bad or failure about yourself  0 0 0    Trouble concentrating 0 0 0    Moving slowly or fidgety/restless 0 0 0    Suicidal thoughts 0 0 0    PHQ-9 Score 0 0 0    Difficult doing work/chores Not difficult at all Not difficult at all          08/17/2022    2:17 PM 08/06/2021    1:47 PM 02/03/2021   10:33 AM  GAD 7 : Generalized Anxiety Score  Nervous, Anxious, on Edge 0 0 0  Control/stop  worrying 0 0 0  Worry too much - different things 0 0 0  Trouble relaxing 0 0 0  Restless 0 0 0  Easily annoyed or irritable 0 0 0  Afraid - awful might happen 0 0 0  Total GAD 7 Score 0 0 0  Anxiety Difficulty Not difficult at all Not difficult at all Not difficult at all   Relevant past medical, surgical, family and social history reviewed and updated as indicated. Interim medical history since our last visit reviewed. Allergies and medications reviewed and updated.  Review of Systems  Constitutional:  Negative for activity change, appetite change, diaphoresis, fatigue and fever.  Respiratory:  Negative for cough, chest tightness and shortness of breath.   Cardiovascular:  Negative for chest pain, palpitations and leg swelling.  Gastrointestinal: Negative.   Neurological: Negative.   Psychiatric/Behavioral: Negative.     Per HPI unless specifically indicated above     Objective:    BP 121/81   Pulse 89   Temp 98.5  F (36.9 C) (Oral)   Ht 5' 5.59" (1.666 m)   Wt 145 lb 12.8 oz (66.1 kg)   LMP 10/07/2016 (Approximate)   SpO2 98%   BMI 23.83 kg/m   Wt Readings from Last 3 Encounters:  08/17/22 145 lb 12.8 oz (66.1 kg)  03/04/22 153 lb 14.4 oz (69.8 kg)  02/04/22 156 lb (70.8 kg)    Physical Exam Vitals and nursing note reviewed.  Constitutional:      General: She is awake. She is not in acute distress.    Appearance: She is well-developed. She is not ill-appearing.  HENT:     Head: Normocephalic and atraumatic.     Right Ear: Hearing, ear canal and external ear normal.     Left Ear: Hearing, ear canal and external ear normal.  Eyes:     General: Lids are normal.        Right eye: No discharge.        Left eye: No discharge.     Conjunctiva/sclera: Conjunctivae normal.     Pupils: Pupils are equal, round, and reactive to light.  Neck:     Thyroid: No thyromegaly.     Vascular: No carotid bruit.     Trachea: Trachea normal.  Cardiovascular:     Rate and Rhythm: Normal rate and regular rhythm.     Heart sounds: Normal heart sounds. No murmur heard.    No gallop.  Pulmonary:     Effort: Pulmonary effort is normal. No accessory muscle usage or respiratory distress.     Breath sounds: Normal breath sounds.  Abdominal:     General: Bowel sounds are normal. There is no distension.     Palpations: Abdomen is soft. There is no hepatomegaly.     Tenderness: There is no abdominal tenderness.     Comments: She wishes to defer rectal exam.  Genitourinary:    Adnexa: Right adnexa normal and left adnexa normal.  Musculoskeletal:        General: Normal range of motion.     Cervical back: Normal range of motion and neck supple.     Right lower leg: No edema.     Left lower leg: No edema.  Lymphadenopathy:     Cervical: No cervical adenopathy.  Skin:    General: Skin is warm and dry.     Capillary Refill: Capillary refill takes less than 2 seconds.     Findings: No rash.  Neurological:  Mental Status: She is alert and oriented to person, place, and time.     Gait: Gait is intact.     Deep Tendon Reflexes: Reflexes are normal and symmetric.     Reflex Scores:      Brachioradialis reflexes are 2+ on the right side and 2+ on the left side.      Patellar reflexes are 2+ on the right side and 2+ on the left side. Psychiatric:        Attention and Perception: Attention normal.        Mood and Affect: Mood normal.        Speech: Speech normal.        Behavior: Behavior normal. Behavior is cooperative.        Thought Content: Thought content normal.        Judgment: Judgment normal.    Results for orders placed or performed in visit on 03/04/22  Basic metabolic panel  Result Value Ref Range   Glucose 99 70 - 99 mg/dL   BUN 16 8 - 27 mg/dL   Creatinine, Ser 2.95 0.57 - 1.00 mg/dL   eGFR 83 >62 ZH/YQM/5.78   BUN/Creatinine Ratio 20 12 - 28   Sodium 141 134 - 144 mmol/L   Potassium 3.9 3.5 - 5.2 mmol/L   Chloride 102 96 - 106 mmol/L   CO2 27 20 - 29 mmol/L   Calcium 10.0 8.7 - 10.3 mg/dL      Assessment & Plan:   Problem List Items Addressed This Visit       Cardiovascular and Mediastinum   Hypertension - Primary    Chronic, stable with BP at goal today in office.  Will check CMP today to ensure she is tolerating and no drop in NA+.  If NA+ drops will stop HCTZ and go up on Lisinopril, could also consider Amlodipine in future if needed.  Recommend she monitor BP at home at least 3 mornings a week and document + focus on DASH diet.  LABS: CMP, CBC, TSH.        Relevant Orders   CBC with Differential/Platelet   Comprehensive metabolic panel   TSH     Digestive   Rectal bleeding    Acute and improved at this time with no further bleeding.  ?hemorrhoid.  She wishes to defer rectal exam today.  Will check CBC, CMP, and Celiac testing due to recent GI upset with bleeding.  Recommend Metamucil daily for constipation and to avoid straining.  FOBT testing sent home with  patient.  Return in 3 weeks for follow-up, sooner if return of bleeding.      Relevant Orders   Fecal occult blood, imunochemical   Celiac Disease Panel     Other   Elevated low density lipoprotein (LDL) cholesterol level    Noted on past labs with LDL <190 and ASCVD 5.8% -- may benefit from statin upcoming, but now continue diet and exercise focus.  No history of MI or CVA.  At this time recheck lipid panel.      Relevant Orders   Comprehensive metabolic panel   Lipid Panel w/o Chol/HDL Ratio   Other Visit Diagnoses     Encounter for screening mammogram for malignant neoplasm of breast       Mammogram ordered today and insructed on how to schedule.   Relevant Orders   MM 3D SCREENING MAMMOGRAM BILATERAL BREAST        Follow up plan: Return in about 3 weeks (around  09/07/2022) for Rectal Bleeding.

## 2022-08-17 NOTE — Assessment & Plan Note (Signed)
Chronic, stable with BP at goal today in office.  Will check CMP today to ensure she is tolerating and no drop in NA+.  If NA+ drops will stop HCTZ and go up on Lisinopril, could also consider Amlodipine in future if needed.  Recommend she monitor BP at home at least 3 mornings a week and document + focus on DASH diet.  LABS: CMP, CBC, TSH.

## 2022-08-17 NOTE — Assessment & Plan Note (Signed)
Acute and improved at this time with no further bleeding.  ?hemorrhoid.  She wishes to defer rectal exam today.  Will check CBC, CMP, and Celiac testing due to recent GI upset with bleeding.  Recommend Metamucil daily for constipation and to avoid straining.  FOBT testing sent home with patient.  Return in 3 weeks for follow-up, sooner if return of bleeding.

## 2022-08-17 NOTE — Assessment & Plan Note (Signed)
Noted on past labs with LDL <190 and ASCVD 5.8% -- may benefit from statin upcoming, but now continue diet and exercise focus.  No history of MI or CVA.  At this time recheck lipid panel.

## 2022-08-18 ENCOUNTER — Encounter: Payer: Self-pay | Admitting: Nurse Practitioner

## 2022-08-18 NOTE — Progress Notes (Signed)
Contacted via MyChart The 10-year ASCVD risk score (Arnett DK, et al., 2019) is: 6.1%   Values used to calculate the score:     Age: 62 years     Sex: Female     Is Non-Hispanic African American: No     Diabetic: No     Tobacco smoker: No     Systolic Blood Pressure: 121 mmHg     Is BP treated: Yes     HDL Cholesterol: 43 mg/dL     Total Cholesterol: 254 mg/dL   Good afternoon Alicia Copeland, your labs have returned: - CBC shows no anemia or infection.  Good news!! - Cholesterol levels remain elevated, we will recheck fasting at physical please.:) - Kidney function, creatinine and eGFR, remains normal, as is liver function, AST and ALT.  - thyroid normal.  Waiting on Celiac labs to return.  Any questions? Keep being awesome!!  Thank you for allowing me to participate in your care.  I appreciate you. Kindest regards, Etienne Millward

## 2022-08-19 LAB — COMPREHENSIVE METABOLIC PANEL
Albumin: 4.7 g/dL (ref 3.9–4.9)
Alkaline Phosphatase: 103 IU/L (ref 44–121)
BUN/Creatinine Ratio: 15 (ref 12–28)
BUN: 13 mg/dL (ref 8–27)
Bilirubin Total: 0.5 mg/dL (ref 0.0–1.2)
CO2: 28 mmol/L (ref 20–29)
Calcium: 10.1 mg/dL (ref 8.7–10.3)
Chloride: 97 mmol/L (ref 96–106)
Globulin, Total: 2.5 g/dL (ref 1.5–4.5)
Glucose: 86 mg/dL (ref 70–99)
Potassium: 3.7 mmol/L (ref 3.5–5.2)

## 2022-08-19 LAB — CBC WITH DIFFERENTIAL/PLATELET
Basos: 1 %
Hemoglobin: 14.1 g/dL (ref 11.1–15.9)
Immature Grans (Abs): 0 10*3/uL (ref 0.0–0.1)
MCH: 29 pg (ref 26.6–33.0)
MCHC: 32.3 g/dL (ref 31.5–35.7)
MCV: 90 fL (ref 79–97)
Monocytes: 6 %
Neutrophils Absolute: 6.6 10*3/uL (ref 1.4–7.0)
Neutrophils: 61 %

## 2022-08-19 LAB — CELIAC DISEASE PANEL

## 2022-08-19 LAB — LIPID PANEL W/O CHOL/HDL RATIO
Cholesterol, Total: 254 mg/dL — ABNORMAL HIGH (ref 100–199)
Triglycerides: 218 mg/dL — ABNORMAL HIGH (ref 0–149)
VLDL Cholesterol Cal: 41 mg/dL — ABNORMAL HIGH (ref 5–40)

## 2022-08-21 LAB — COMPREHENSIVE METABOLIC PANEL
ALT: 18 IU/L (ref 0–32)
AST: 18 IU/L (ref 0–40)
Creatinine, Ser: 0.87 mg/dL (ref 0.57–1.00)
Sodium: 141 mmol/L (ref 134–144)
Total Protein: 7.2 g/dL (ref 6.0–8.5)
eGFR: 76 mL/min/{1.73_m2} (ref >59)

## 2022-08-21 LAB — CBC WITH DIFFERENTIAL/PLATELET
Basophils Absolute: 0.1 10*3/uL (ref 0.0–0.2)
EOS (ABSOLUTE): 0.2 10*3/uL (ref 0.0–0.4)
Eos: 2 %
Hematocrit: 43.6 % (ref 34.0–46.6)
Immature Granulocytes: 0 %
Lymphocytes Absolute: 3.3 10*3/uL — ABNORMAL HIGH (ref 0.7–3.1)
Lymphs: 30 %
Monocytes Absolute: 0.6 10*3/uL (ref 0.1–0.9)
Platelets: 239 10*3/uL (ref 150–450)
RBC: 4.86 x10E6/uL (ref 3.77–5.28)
RDW: 13.9 % (ref 11.7–15.4)
WBC: 10.7 10*3/uL (ref 3.4–10.8)

## 2022-08-21 LAB — TSH: TSH: 2.91 u[IU]/mL (ref 0.450–4.500)

## 2022-08-21 LAB — CELIAC DISEASE PANEL
Endomysial IgA: NEGATIVE
IgA/Immunoglobulin A, Serum: 211 mg/dL (ref 87–352)

## 2022-08-21 LAB — LIPID PANEL W/O CHOL/HDL RATIO
HDL: 43 mg/dL (ref >39)
LDL Chol Calc (NIH): 170 mg/dL — ABNORMAL HIGH (ref 0–99)

## 2022-08-21 NOTE — Progress Notes (Signed)
Contacted via MyChart   Celiac testing all negative:)

## 2022-09-04 NOTE — Patient Instructions (Signed)
 Rectal Bleeding    Rectal bleeding is when blood comes out of the opening of the butt (anus). You may see bright red blood in your underwear or in the toilet after you poop (have a bowel movement). You may also have blood mixed with your poop (stool), or dark red or black poop.  Rectal bleeding is often a sign that something is wrong. It can be caused by many things. It needs to be checked by a doctor.  Follow these instructions at home:  Medicines  Take over-the-counter and prescription medicines only as told by your doctor.  Ask your doctor about changing or stopping your normal medicines. These include blood thinners.  Managing constipation    Your condition may cause trouble pooping (constipation). To prevent or treat this, or to help make your poop soft, you may need to:  Drink enough fluid to keep your pee (urine) pale yellow.  Take over-the-counter or prescription medicines.  Eat foods that are high in fiber. These include beans, whole grains, and fresh fruits and vegetables.  Limit foods that are high in fat and sugar. These include fried or sweet foods.     General instructions  Try not to strain when you poop.  Take a warm bath. This may help with pain.  Watch for changes in your symptoms.  Contact a doctor if:  You have pain or swelling in your belly (abdomen).  You have a fever.  You feel weak or like you may vomit.  You cannot poop.  You have new or more bleeding.  You have black or dark red poop.  You vomit blood or something that looks like coffee grounds.  Get help right away if:  You faint.  You have very bad pain in your butt.  These symptoms may be an emergency. Get help right away. Call 911.  Do not wait to see if the symptoms will go away.  Do not drive yourself to the hospital.  This information is not intended to replace advice given to you by your health care provider. Make sure you discuss any questions you have with your health care provider.  Document Revised: 09/14/2021 Document Reviewed:  09/14/2021  Elsevier Patient Education  2024 [employer].

## 2022-09-07 ENCOUNTER — Ambulatory Visit: Payer: BC Managed Care – PPO | Admitting: Nurse Practitioner

## 2022-09-07 ENCOUNTER — Encounter: Payer: Self-pay | Admitting: Nurse Practitioner

## 2022-09-07 VITALS — BP 124/73 | HR 85 | Temp 98.8°F | Wt 146.6 lb

## 2022-09-07 DIAGNOSIS — K625 Hemorrhage of anus and rectum: Secondary | ICD-10-CM

## 2022-09-07 NOTE — Progress Notes (Signed)
BP 124/73   Pulse 85   Temp 98.8 F (37.1 C) (Oral)   Wt 146 lb 9.6 oz (66.5 kg)   LMP 10/07/2016 (Approximate)   SpO2 98%   BMI 23.96 kg/m    Subjective:    Patient ID: Alicia Copeland, female    DOB: 1961-01-17, 62 y.o.   MRN: [id number]  HPI: Alicia Copeland is a 62 y.o. female  Chief Complaint  Patient presents with   Rectal Bleeding    3 week f/up- patient states she has not had any bleeding or issues with BM's since last visit.    RECTAL BLEEDING Follow-up today for rectal bleeding, initially reported at visit on 08/17/22. She reports no further bleeding since initial event.  Taking fiber and has changed diet completely since learning about cholesterol levels -- has felt a lot better.  History: Initially bleeding started 08/15/22 -- Sunday night had cramping and GI discomfort, went to bathroom and she started sweating/nausea -- then next morning had bowel movement and blood in toilet noticed with clots.  No pain at this time, just cramping.  Then on Tuesday morning had regular BM and no blood noted, stool was brown.  Last colonoscopy on 10/04/21 with diverticulosis noted and polyp removal x 3.   Duration: days Bright red rectal bleeding: none Amount of blood: none Frequency: as above Melena: no  Spotting on toilet tissue: no Anal fullness: no  Perianal pain: no  Severity: none Perianal irritation/itching: no  Constipation: improving with fiber supplement  Chronic straining/valsava: improved Anal trauma/intercourse: no  Hemorrhoids: on colonoscopy in 2012  Previous colonoscopy: yes    Relevant past medical, surgical, family and social history reviewed and updated as indicated. Interim medical history since our last visit reviewed. Allergies and medications reviewed and updated.  Review of Systems  Constitutional:  Negative for activity change, appetite change, diaphoresis, fatigue and fever.  Respiratory:  Negative for cough, chest tightness and shortness of  breath.   Cardiovascular:  Negative for chest pain, palpitations and leg swelling.  Gastrointestinal: Negative.   Neurological: Negative.   Psychiatric/Behavioral: Negative.     Per HPI unless specifically indicated above     Objective:    BP 124/73   Pulse 85   Temp 98.8 F (37.1 C) (Oral)   Wt 146 lb 9.6 oz (66.5 kg)   LMP 10/07/2016 (Approximate)   SpO2 98%   BMI 23.96 kg/m   Wt Readings from Last 3 Encounters:  09/07/22 146 lb 9.6 oz (66.5 kg)  08/17/22 145 lb 12.8 oz (66.1 kg)  03/04/22 153 lb 14.4 oz (69.8 kg)    Physical Exam Vitals and nursing note reviewed.  Constitutional:      General: She is awake. She is not in acute distress.    Appearance: She is well-developed. She is not ill-appearing.  HENT:     Head: Normocephalic and atraumatic.     Right Ear: Hearing, ear canal and external ear normal.     Left Ear: Hearing, ear canal and external ear normal.  Eyes:     General: Lids are normal.        Right eye: No discharge.        Left eye: No discharge.     Conjunctiva/sclera: Conjunctivae normal.     Pupils: Pupils are equal, round, and reactive to light.  Neck:     Thyroid: No thyromegaly.     Vascular: No carotid bruit.     Trachea: Trachea normal.  Cardiovascular:  Rate and Rhythm: Normal rate and regular rhythm.     Heart sounds: Normal heart sounds. No murmur heard.    No gallop.  Pulmonary:     Effort: Pulmonary effort is normal. No accessory muscle usage or respiratory distress.     Breath sounds: Normal breath sounds.  Abdominal:     General: Bowel sounds are normal. There is no distension.     Palpations: Abdomen is soft. There is no hepatomegaly.     Tenderness: There is no abdominal tenderness.     Comments: She wishes to defer rectal exam.  Genitourinary:    Adnexa: Right adnexa normal and left adnexa normal.  Musculoskeletal:        General: Normal range of motion.     Cervical back: Normal range of motion and neck supple.      Right lower leg: No edema.     Left lower leg: No edema.  Lymphadenopathy:     Cervical: No cervical adenopathy.  Skin:    General: Skin is warm and dry.     Capillary Refill: Capillary refill takes less than 2 seconds.     Findings: No rash.  Neurological:     Mental Status: She is alert and oriented to person, place, and time.     Gait: Gait is intact.     Deep Tendon Reflexes: Reflexes are normal and symmetric.     Reflex Scores:      Brachioradialis reflexes are 2+ on the right side and 2+ on the left side.      Patellar reflexes are 2+ on the right side and 2+ on the left side. Psychiatric:        Attention and Perception: Attention normal.        Mood and Affect: Mood normal.        Speech: Speech normal.        Behavior: Behavior normal. Behavior is cooperative.        Thought Content: Thought content normal.        Judgment: Judgment normal.    Results for orders placed or performed in visit on 08/17/22  CBC with Differential/Platelet  Result Value Ref Range   WBC 10.7 3.4 - 10.8 x10E3/uL   RBC 4.86 3.77 - 5.28 x10E6/uL   Hemoglobin 14.1 11.1 - 15.9 g/dL   Hematocrit 82.9 56.2 - 46.6 %   MCV 90 79 - 97 fL   MCH 29.0 26.6 - 33.0 pg   MCHC 32.3 31.5 - 35.7 g/dL   RDW 13.0 86.5 - 78.4 %   Platelets 239 150 - 450 x10E3/uL   Neutrophils 61 Not Estab. %   Lymphs 30 Not Estab. %   Monocytes 6 Not Estab. %   Eos 2 Not Estab. %   Basos 1 Not Estab. %   Neutrophils Absolute 6.6 1.4 - 7.0 x10E3/uL   Lymphocytes Absolute 3.3 (H) 0.7 - 3.1 x10E3/uL   Monocytes Absolute 0.6 0.1 - 0.9 x10E3/uL   EOS (ABSOLUTE) 0.2 0.0 - 0.4 x10E3/uL   Basophils Absolute 0.1 0.0 - 0.2 x10E3/uL   Immature Granulocytes 0 Not Estab. %   Immature Grans (Abs) 0.0 0.0 - 0.1 x10E3/uL  Comprehensive metabolic panel  Result Value Ref Range   Glucose 86 70 - 99 mg/dL   BUN 13 8 - 27 mg/dL   Creatinine, Ser 6.96 0.57 - 1.00 mg/dL   eGFR 76 >29 BM/WUX/3.24   BUN/Creatinine Ratio 15 12 - 28   Sodium  141 134 -  144 mmol/L   Potassium 3.7 3.5 - 5.2 mmol/L   Chloride 97 96 - 106 mmol/L   CO2 28 20 - 29 mmol/L   Calcium 10.1 8.7 - 10.3 mg/dL   Total Protein 7.2 6.0 - 8.5 g/dL   Albumin 4.7 3.9 - 4.9 g/dL   Globulin, Total 2.5 1.5 - 4.5 g/dL   Bilirubin Total 0.5 0.0 - 1.2 mg/dL   Alkaline Phosphatase 103 44 - 121 IU/L   AST 18 0 - 40 IU/L   ALT 18 0 - 32 IU/L  Lipid Panel w/o Chol/HDL Ratio  Result Value Ref Range   Cholesterol, Total 254 (H) 100 - 199 mg/dL   Triglycerides 657 (H) 0 - 149 mg/dL   HDL 43 >84 mg/dL   VLDL Cholesterol Cal 41 (H) 5 - 40 mg/dL   LDL Chol Calc (NIH) 696 (H) 0 - 99 mg/dL  TSH  Result Value Ref Range   TSH 2.910 0.450 - 4.500 uIU/mL  Celiac Disease Panel  Result Value Ref Range   Endomysial IgA Negative Negative   Transglutaminase IgA <2 0 - 3 U/mL   IgA/Immunoglobulin A, Serum 211 87 - 352 mg/dL      Assessment & Plan:   Problem List Items Addressed This Visit       Digestive   Rectal bleeding - Primary    Improved, no further issues.  Suspect was hemorrhoids.  Monitor closely.         Follow up plan: Return in about 5 months (around 01/31/2023) for HTN/HLD.

## 2022-09-07 NOTE — Assessment & Plan Note (Signed)
Improved, no further issues.  Suspect was hemorrhoids.  Monitor closely.

## 2022-11-09 ENCOUNTER — Ambulatory Visit: Payer: Self-pay | Admitting: *Deleted

## 2022-11-09 NOTE — Telephone Encounter (Signed)
Summary: stung by a yellow jacket on Saturday.   Patient has called and states she was stung by a jellow jacket on Saturday 11/05/2022 and she has been scheduled for an appt in the morning at 11am at [facility]. Patient wants clinical advice how to care for this tonight until her appt tomorrow as it is swollen, red and itchy.  She states that she is worried about it turning into cellulitis and states she has not put anything on it it.   Patients callback #  [phone]         Call to patient- she got worried and went to [facility]- she is there now. Office appointment canceled

## 2022-11-10 ENCOUNTER — Ambulatory Visit: Payer: BC Managed Care – PPO | Admitting: Family Medicine

## 2022-12-21 ENCOUNTER — Ambulatory Visit
Admission: RE | Admit: 2022-12-21 | Discharge: 2022-12-21 | Disposition: A | Discharge status: Home / Self Care | Payer: BC Managed Care – PPO | Source: Ambulatory Visit | Attending: Nurse Practitioner | Admitting: Nurse Practitioner

## 2022-12-21 DIAGNOSIS — Z1231 Encounter for screening mammogram for malignant neoplasm of breast: Secondary | ICD-10-CM | POA: Diagnosis present

## 2023-01-11 ENCOUNTER — Other Ambulatory Visit: Payer: Self-pay | Admitting: Nurse Practitioner

## 2023-01-11 ENCOUNTER — Other Ambulatory Visit: Payer: BC Managed Care – PPO

## 2023-01-11 ENCOUNTER — Encounter: Payer: Self-pay | Admitting: Nurse Practitioner

## 2023-01-11 DIAGNOSIS — R399 Unspecified symptoms and signs involving the genitourinary system: Secondary | ICD-10-CM

## 2023-01-11 LAB — URINALYSIS, ROUTINE W REFLEX MICROSCOPIC
Bilirubin, UA: NEGATIVE
Glucose, UA: NEGATIVE
Ketones, UA: NEGATIVE
Nitrite, UA: NEGATIVE
Protein,UA: NEGATIVE
Specific Gravity, UA: 1.015 (ref 1.005–1.030)
Urobilinogen, Ur: 0.2 mg/dL (ref 0.2–1.0)
pH, UA: 6.5 (ref 5.0–7.5)

## 2023-01-11 LAB — WET PREP FOR TRICH, YEAST, CLUE
Clue Cell Exam: NEGATIVE
Trichomonas Exam: NEGATIVE
Yeast Exam: NEGATIVE

## 2023-01-11 LAB — MICROSCOPIC EXAMINATION: Bacteria, UA: NONE SEEN

## 2023-01-11 NOTE — Progress Notes (Unsigned)
   LMP 10/07/2016 (Approximate)    Subjective:    Patient ID: Alicia Copeland, female    DOB: 30-Sep-1960, 62 y.o.   MRN: [id number]  HPI: Alicia Copeland is a 62 y.o. female  No chief complaint on file.  URINARY SYMPTOMS  Dysuria: {Blank single:19197::"yes","no","burning"} Urinary frequency: {Blank single:19197::"yes","no"} Urgency: {Blank single:19197::"yes","no"} Small volume voids: {Blank single:19197::"yes","no"} Symptom severity: {Blank single:19197::"yes","no"} Urinary incontinence: {Blank single:19197::"yes","no"} Foul odor: {Blank single:19197::"yes","no"} Hematuria: {Blank single:19197::"yes","no"} Abdominal pain: {Blank single:19197::"yes","no"} Back pain: {Blank single:19197::"yes","no"} Suprapubic pain/pressure: {Blank single:19197::"yes","no"} Flank pain: {Blank single:19197::"yes","no"} Fever:  {Blank multiple:19196::"yes","no","subjective","low grade"} Vomiting: {Blank single:19197::"yes","no"} Relief with cranberry juice: {Blank single:19197::"yes","no"} Relief with pyridium: {Blank single:19197::"yes","no"} Status: better/worse/stable Previous urinary tract infection: {Blank single:19197::"yes","no"} Recurrent urinary tract infection: {Blank single:19197::"yes","no"} Sexual activity: No sexually active/monogomous/practicing safe sex History of sexually transmitted disease: {Blank single:19197::"yes","no"} Penile discharge: {Blank single:19197::"yes","no"} Treatments attempted: {Blank multiple:19196::"none","antibiotics","pyridium","cranberry","increasing fluids"}   Relevant past medical, surgical, family and social history reviewed and updated as indicated. Interim medical history since our last visit reviewed. Allergies and medications reviewed and updated.  Review of Systems  Per HPI unless specifically indicated above     Objective:    LMP 10/07/2016 (Approximate)   Wt Readings from Last 3 Encounters:  09/07/22 146 lb 9.6 oz (66.5 kg)  08/17/22  145 lb 12.8 oz (66.1 kg)  03/04/22 153 lb 14.4 oz (69.8 kg)    Physical Exam  Results for orders placed or performed in visit on 01/11/23  WET PREP FOR TRICH, YEAST, CLUE   Specimen: Urine   Urine  Result Value Ref Range   Trichomonas Exam Negative Negative   Yeast Exam Negative Negative   Clue Cell Exam Negative Negative  Microscopic Examination   Urine  Result Value Ref Range   WBC, UA 0-5 0 - 5 /hpf   RBC, Urine 3-10 (A) 0 - 2 /hpf   Epithelial Cells (non renal) 0-10 0 - 10 /hpf   Bacteria, UA None seen None seen/Few  Urinalysis, Routine w reflex microscopic  Result Value Ref Range   Specific Gravity, UA 1.015 1.005 - 1.030   pH, UA 6.5 5.0 - 7.5   Color, UA Yellow Yellow   Appearance Ur Clear Clear   Leukocytes,UA 1+ (A) Negative   Protein,UA Negative Negative/Trace   Glucose, UA Negative Negative   Ketones, UA Negative Negative   RBC, UA 2+ (A) Negative   Bilirubin, UA Negative Negative   Urobilinogen, Ur 0.2 0.2 - 1.0 mg/dL   Nitrite, UA Negative Negative   Microscopic Examination See below:       Assessment & Plan:   Problem List Items Addressed This Visit   None    Follow up plan: No follow-ups on file.

## 2023-01-12 ENCOUNTER — Encounter: Payer: Self-pay | Admitting: Nurse Practitioner

## 2023-01-12 ENCOUNTER — Telehealth (INDEPENDENT_AMBULATORY_CARE_PROVIDER_SITE_OTHER): Payer: BC Managed Care – PPO | Admitting: Nurse Practitioner

## 2023-01-12 DIAGNOSIS — N3001 Acute cystitis with hematuria: Secondary | ICD-10-CM | POA: Diagnosis not present

## 2023-01-12 MED ORDER — NITROFURANTOIN MONOHYD MACRO 100 MG PO CAPS: 100.0000 mg | ORAL_CAPSULE | Freq: Two times a day (BID) | ORAL | 0 refills | Status: DC

## 2023-01-16 LAB — URINE CULTURE

## 2023-02-04 NOTE — Patient Instructions (Signed)
 Be Involved in Caring For Your Health:  Taking Medications When medications are taken as directed, they can greatly improve your health. But if they are not taken as prescribed, they may not work. In some cases, not taking them correctly can be harmful. To help ensure your treatment remains effective and safe, understand your medications and how to take them. Bring your medications to each visit for review by your provider.  Your lab results, notes, and after visit summary will be available on My Chart. We strongly encourage you to use this feature. If lab results are abnormal the clinic will contact you with the appropriate steps. If the clinic does not contact you assume the results are satisfactory. You can always view your results on My Chart. If you have questions regarding your health or results, please contact the clinic during office hours. You can also ask questions on My Chart.  We at [facility] are grateful that you chose us  to provide your care. We strive to provide evidence-based and compassionate care and are always looking for feedback. If you get a survey from the clinic please complete this so we can hear your opinions.  DASH Eating Plan DASH stands for Dietary Approaches to Stop Hypertension. The DASH eating plan is a healthy eating plan that has been shown to: Lower high blood pressure (hypertension). Reduce your risk for type 2 diabetes, heart disease, and stroke. Help with weight loss. What are tips for following this plan? Reading food labels Check food labels for the amount of salt (sodium) per serving. Choose foods with less than 5 percent of the Daily Value (DV) of sodium. In general, foods with less than 300 milligrams (mg) of sodium per serving fit into this eating plan. To find whole grains, look for the word whole as the first word in the ingredient list. Shopping Buy products labeled as low-sodium or no salt added. Buy fresh foods. Avoid canned  foods and pre-made or frozen meals. Cooking Try not to add salt when you cook. Use salt-free seasonings or herbs instead of table salt or sea salt. Check with your health care provider or pharmacist before using salt substitutes. Do not fry foods. Cook foods in healthy ways, such as baking, boiling, grilling, roasting, or broiling. Cook using oils that are good for your heart. These include olive, canola, avocado, soybean, and sunflower oil. Meal planning  Eat a balanced diet. This should include: 4 or more servings of fruits and 4 or more servings of vegetables each day. Try to fill half of your plate with fruits and vegetables. 6-8 servings of whole grains each day. 6 or less servings of lean meat, poultry, or fish each day. 1 oz is 1 serving. A 3 oz (85 g) serving of meat is about the same size as the palm of your hand. One egg is 1 oz (28 g). 2-3 servings of low-fat dairy each day. One serving is 1 cup (237 mL). 1 serving of nuts, seeds, or beans 5 times each week. 2-3 servings of heart-healthy fats. Healthy fats called omega-3 fatty acids are found in foods such as walnuts, flaxseeds, fortified milks, and eggs. These fats are also found in cold-water fish, such as sardines, salmon, and mackerel. Limit how much you eat of: Canned or prepackaged foods. Food that is high in trans fat, such as fried foods. Food that is high in saturated fat, such as fatty meat. Desserts and other sweets, sugary drinks, and other foods with added sugar. Full-fat  dairy products. Do not salt foods before eating. Do not eat more than 4 egg yolks a week. Try to eat at least 2 vegetarian meals a week. Eat more home-cooked food and less restaurant, buffet, and fast food. Lifestyle When eating at a restaurant, ask if your food can be made with less salt or no salt. If you drink alcohol: Limit how much you have to: 0-1 drink a day if you are female. 0-2 drinks a day if you are female. Know how much alcohol is in  your drink. In the [place] one drink is one 12 oz bottle of beer (355 mL), one 5 oz glass of wine (148 mL), or one 1 oz glass of hard liquor (44 mL). General information Avoid eating more than 2,300 mg of salt a day. If you have hypertension, you may need to reduce your sodium intake to 1,500 mg a day. Work with your provider to stay at a healthy body weight or lose weight. Ask what the best weight range is for you. On most days of the week, get at least 30 minutes of exercise that causes your heart to beat faster. This may include walking, swimming, or biking. Work with your provider or dietitian to adjust your eating plan to meet your specific calorie needs. What foods should I eat? Fruits All fresh, dried, or frozen fruit. Canned fruits that are in their natural juice and do not have sugar added to them. Vegetables Fresh or frozen vegetables that are raw, steamed, roasted, or grilled. Low-sodium or reduced-sodium tomato and vegetable juice. Low-sodium or reduced-sodium tomato sauce and tomato paste. Low-sodium or reduced-sodium canned vegetables. Grains Whole-grain or whole-wheat bread. Whole-grain or whole-wheat pasta. Brown rice. [place]il Madeira. Bulgur. Whole-grain and low-sodium cereals. Pita bread. Low-fat, low-sodium crackers. Whole-wheat flour tortillas. Meats and other proteins Skinless chicken or [place]. Ground chicken or [place]. Pork with fat trimmed off. Fish and seafood. Egg whites. Dried beans, peas, or lentils. Unsalted nuts, nut butters, and seeds. Unsalted canned beans. Lean cuts of beef with fat trimmed off. Low-sodium, lean precooked or cured meat, such as sausages or meat loaves. Dairy Low-fat (1%) or fat-free (skim) milk. Reduced-fat, low-fat, or fat-free cheeses. Nonfat, low-sodium ricotta or cottage cheese. Low-fat or nonfat yogurt. Low-fat, low-sodium cheese. Fats and oils Soft margarine without trans fats. Vegetable oil. Reduced-fat, low-fat, or light mayonnaise and salad  dressings (reduced-sodium). Canola, safflower, olive, avocado, soybean, and sunflower oils. Avocado. Seasonings and condiments Herbs. Spices. Seasoning mixes without salt. Other foods Unsalted popcorn and pretzels. Fat-free sweets. The items listed above may not be all the foods and drinks you can have. Talk to a dietitian to learn more. What foods should I avoid? Fruits Canned fruit in a light or heavy syrup. Fried fruit. Fruit in cream or butter sauce. Vegetables Creamed or fried vegetables. Vegetables in a cheese sauce. Regular canned vegetables that are not marked as low-sodium or reduced-sodium. Regular canned tomato sauce and paste that are not marked as low-sodium or reduced-sodium. Regular tomato and vegetable juices that are not marked as low-sodium or reduced-sodium. Dene. Olives. Grains Baked goods made with fat, such as croissants, muffins, or some breads. Dry pasta or rice meal packs. Meats and other proteins Fatty cuts of meat. Ribs. Fried meat. [place]. Bologna, salami, and other precooked or cured meats, such as sausages or meat loaves, that are not lean and low in sodium. Fat from the back of a pig (fatback). Bratwurst. Salted nuts and seeds. Canned beans with added salt. Canned  or smoked fish. Whole eggs or egg yolks. Chicken or [place] with skin. Dairy Whole or 2% milk, cream, and half-and-half. Whole or full-fat cream cheese. Whole-fat or sweetened yogurt. Full-fat cheese. Nondairy creamers. Whipped toppings. Processed cheese and cheese spreads. Fats and oils Butter. Stick margarine. Lard. Shortening. Ghee. Bacon fat. Tropical oils, such as coconut, palm kernel, or palm oil. Seasonings and condiments Onion salt, garlic salt, seasoned salt, table salt, and sea salt. Worcestershire sauce. Tartar sauce. Barbecue sauce. Teriyaki sauce. Soy sauce, including reduced-sodium soy sauce. Steak sauce. Canned and packaged gravies. Fish sauce. Oyster sauce. Cocktail sauce. Store-bought  horseradish. Ketchup. Mustard. Meat flavorings and tenderizers. Bouillon cubes. Hot sauces. Pre-made or packaged marinades. Pre-made or packaged taco seasonings. Relishes. Regular salad dressings. Other foods Salted popcorn and pretzels. The items listed above may not be all the foods and drinks you should avoid. Talk to a dietitian to learn more. Where to find more information National Heart, Lung, and Blood Institute (NHLBI): BuffaloDryCleaner.gl American Heart Association (AHA): heart.org Academy of Nutrition and Dietetics: eatright.org National Kidney Foundation (NKF): kidney.org This information is not intended to replace advice given to you by your health care provider. Make sure you discuss any questions you have with your health care provider. Document Revised: 02/10/2022 Document Reviewed: 02/10/2022 Elsevier Patient Education  2024 [employer].

## 2023-02-07 ENCOUNTER — Encounter: Payer: Self-pay | Admitting: Nurse Practitioner

## 2023-02-07 ENCOUNTER — Ambulatory Visit: Payer: BC Managed Care – PPO | Admitting: Nurse Practitioner

## 2023-02-07 VITALS — BP 127/84 | HR 80 | Temp 97.9°F | Ht 66.0 in | Wt 144.4 lb

## 2023-02-07 DIAGNOSIS — I10 Essential (primary) hypertension: Secondary | ICD-10-CM

## 2023-02-07 DIAGNOSIS — E7800 Pure hypercholesterolemia, unspecified: Secondary | ICD-10-CM

## 2023-02-07 NOTE — Progress Notes (Signed)
 BP 127/84 (BP Location: Left Arm, Cuff Size: Normal)   Pulse 80   Temp 97.9 F (36.6 C) (Oral)   Ht 5' 6 (1.676 m)   Wt 144 lb 6.4 oz (65.5 kg)   LMP 10/07/2016 (Approximate)   SpO2 97%   BMI 23.31 kg/m    Subjective:    Patient ID: Alicia Copeland, female    DOB: 09-Nov-1960, 62 y.o.   MRN: [id number]  HPI: Alicia Copeland is a 62 y.o. female  Chief Complaint  Patient presents with   Hyperlipidemia   Hypertension   HYPERTENSION / HYPERLIPIDEMIA Continues on Lisinopril -hydrochlorothiazide  20-12.5 MG. Currently no hyponatremia with hydrochlorothiazide , history of in past with this.  She has not had a soft drink since July, drinking mostly flavored water. Satisfied with current treatment? yes Duration of hypertension: chronic BP monitoring frequency: not often BP range:  BP medication side effects: no Duration of hyperlipidemia: chronic Medication compliance: good compliance Aspirin: no Recent stressors: no Recurrent headaches: no Visual changes: no Palpitations: no Dyspnea: no Chest pain: no Lower extremity edema: no Dizzy/lightheaded: no  The 10-year ASCVD risk score (Arnett DK, et al., 2019) is: 6.7%   Values used to calculate the score:     Age: 46 years     Sex: Female     Is Non-Hispanic African American: No     Diabetic: No     Tobacco smoker: No     Systolic Blood Pressure: 127 mmHg     Is BP treated: Yes     HDL Cholesterol: 43 mg/dL     Total Cholesterol: 254 mg/dL     87/68/7975   89:78 AM 09/07/2022    1:11 PM 08/17/2022    2:16 PM 08/06/2021    1:47 PM 02/03/2021   10:33 AM  Depression screen PHQ 2/9  Decreased Interest 0 0 0 0 0  Down, Depressed, Hopeless 0 0 0 0 0  PHQ - 2 Score 0 0 0 0 0  Altered sleeping 0 0 0 0 0  Tired, decreased energy 0 0 0 0 0  Change in appetite 0 0 0 0 0  Feeling bad or failure about yourself  0 0 0 0 0  Trouble concentrating 0 0 0 0 0  Moving slowly or fidgety/restless 0 0 0 0 0  Suicidal thoughts 0 0 0 0 0   PHQ-9 Score 0 0 0 0 0  Difficult doing work/chores Not difficult at all Not difficult at all Not difficult at all Not difficult at all        02/07/2023   10:21 AM 09/07/2022    1:12 PM 08/17/2022    2:17 PM 08/06/2021    1:47 PM  GAD 7 : Generalized Anxiety Score  Nervous, Anxious, on Edge 0 0 0 0  Control/stop worrying 0 0 0 0  Worry too much - different things 0 0 0 0  Trouble relaxing 0 0 0 0  Restless 0 0 0 0  Easily annoyed or irritable 0 0 0 0  Afraid - awful might happen 0 0 0 0  Total GAD 7 Score 0 0 0 0  Anxiety Difficulty Not difficult at all Not difficult at all Not difficult at all Not difficult at all   Relevant past medical, surgical, family and social history reviewed and updated as indicated. Interim medical history since our last visit reviewed. Allergies and medications reviewed and updated.  Review of Systems  Constitutional:  Negative for activity change, appetite  change, diaphoresis, fatigue and fever.  Respiratory:  Negative for cough, chest tightness and shortness of breath.   Cardiovascular:  Negative for chest pain, palpitations and leg swelling.  Gastrointestinal: Negative.   Neurological: Negative.   Psychiatric/Behavioral: Negative.     Per HPI unless specifically indicated above     Objective:    BP 127/84 (BP Location: Left Arm, Cuff Size: Normal)   Pulse 80   Temp 97.9 F (36.6 C) (Oral)   Ht 5' 6 (1.676 m)   Wt 144 lb 6.4 oz (65.5 kg)   LMP 10/07/2016 (Approximate)   SpO2 97%   BMI 23.31 kg/m   Wt Readings from Last 3 Encounters:  02/07/23 144 lb 6.4 oz (65.5 kg)  09/07/22 146 lb 9.6 oz (66.5 kg)  08/17/22 145 lb 12.8 oz (66.1 kg)    Physical Exam Vitals and nursing note reviewed.  Constitutional:      General: She is awake. She is not in acute distress.    Appearance: She is well-developed and well-groomed. She is not ill-appearing or toxic-appearing.  HENT:     Head: Normocephalic.     Right Ear: Hearing and external ear  normal.     Left Ear: Hearing and external ear normal.  Eyes:     General: Lids are normal.        Right eye: No discharge.        Left eye: No discharge.     Conjunctiva/sclera: Conjunctivae normal.     Pupils: Pupils are equal, round, and reactive to light.  Neck:     Thyroid: No thyromegaly.     Vascular: No carotid bruit.  Cardiovascular:     Rate and Rhythm: Normal rate and regular rhythm.     Heart sounds: Normal heart sounds. No murmur heard.    No gallop.  Pulmonary:     Effort: Pulmonary effort is normal. No accessory muscle usage or respiratory distress.     Breath sounds: Normal breath sounds.  Abdominal:     General: Bowel sounds are normal. There is no distension.     Palpations: Abdomen is soft.     Tenderness: There is no abdominal tenderness.  Musculoskeletal:     Cervical back: Normal range of motion and neck supple.     Right lower leg: No edema.     Left lower leg: No edema.  Lymphadenopathy:     Cervical: No cervical adenopathy.  Skin:    General: Skin is warm and dry.  Neurological:     Mental Status: She is alert and oriented to person, place, and time.     Deep Tendon Reflexes: Reflexes are normal and symmetric.     Reflex Scores:      Brachioradialis reflexes are 2+ on the right side and 2+ on the left side.      Patellar reflexes are 2+ on the right side and 2+ on the left side. Psychiatric:        Attention and Perception: Attention normal.        Mood and Affect: Mood normal.        Speech: Speech normal.        Behavior: Behavior normal. Behavior is cooperative.        Thought Content: Thought content normal.    Results for orders placed or performed in visit on 01/12/23  Urine Culture   Collection Time: 01/13/23 10:27 AM   Specimen: Urine   UR  Result Value Ref Range   Urine  Culture, Routine Final report (A)    Organism ID, Bacteria Escherichia coli (A)    Antimicrobial Susceptibility Comment       Assessment & Plan:   Problem List  Items Addressed This Visit       Cardiovascular and Mediastinum   Hypertension - Primary   Chronic, stable with BP at goal today in office.  Will check BMP today to ensure no drop in NA+.  If NA+ drops will stop HCTZ and go up on Lisinopril , could also consider Amlodipine in future if needed.  Recommend she monitor BP at home at least 3 mornings a week and document + focus on DASH diet.  LABS: BMP.       Relevant Orders   Basic metabolic panel     Other   Elevated low density lipoprotein (LDL) cholesterol level   Noted on past labs with LDL <190 and ASCVD 6.7% -- may benefit from statin upcoming, but now continue diet and exercise focus.  No history of MI or CVA.  At this time recheck lipid panel.      Relevant Orders   Lipid Panel w/o Chol/HDL Ratio     Follow up plan: Return in about 6 months (around 08/07/2023) for HTN/HLD.

## 2023-02-07 NOTE — Assessment & Plan Note (Signed)
 Chronic, stable with BP at goal today in office.  Will check BMP today to ensure no drop in NA+.  If NA+ drops will stop HCTZ and go up on Lisinopril , could also consider Amlodipine in future if needed.  Recommend she monitor BP at home at least 3 mornings a week and document + focus on DASH diet.  LABS: BMP.

## 2023-02-07 NOTE — Assessment & Plan Note (Signed)
 Noted on past labs with LDL <190 and ASCVD 6.7% -- may benefit from statin upcoming, but now continue diet and exercise focus.  No history of MI or CVA.  At this time recheck lipid panel.

## 2023-02-08 ENCOUNTER — Encounter: Payer: Self-pay | Admitting: Nurse Practitioner

## 2023-02-08 LAB — BASIC METABOLIC PANEL
BUN/Creatinine Ratio: 25 (ref 12–28)
BUN: 20 mg/dL (ref 8–27)
CO2: 27 mmol/L (ref 20–29)
Calcium: 9.9 mg/dL (ref 8.7–10.3)
Chloride: 103 mmol/L (ref 96–106)
Creatinine, Ser: 0.79 mg/dL (ref 0.57–1.00)
Glucose: 111 mg/dL — ABNORMAL HIGH (ref 70–99)
Potassium: 4 mmol/L (ref 3.5–5.2)
Sodium: 144 mmol/L (ref 134–144)
eGFR: 85 mL/min/{1.73_m2} (ref >59)

## 2023-02-08 LAB — LIPID PANEL W/O CHOL/HDL RATIO
Cholesterol, Total: 243 mg/dL — ABNORMAL HIGH (ref 100–199)
HDL: 45 mg/dL (ref >39)
LDL Chol Calc (NIH): 168 mg/dL — ABNORMAL HIGH (ref 0–99)
Triglycerides: 163 mg/dL — ABNORMAL HIGH (ref 0–149)
VLDL Cholesterol Cal: 30 mg/dL (ref 5–40)

## 2023-02-08 NOTE — Progress Notes (Signed)
 Contacted via MyChart The 10-year ASCVD risk score (Arnett DK, et al., 2019) is: 6.3%   Values used to calculate the score:     Age: 63 years     Sex: Female     Is Non-Hispanic African American: No     Diabetic: No     Tobacco smoker: No     Systolic Blood Pressure: 127 mmHg     Is BP treated: Yes     HDL Cholesterol: 45 mg/dL     Total Cholesterol: 243 mg/dL   Good morning Delon, you labs have returned: - Kidney function, creatinine and eGFR, remains normal.  Glucose, sugar, is a little elevated which we will monitor closely. - Lipid panel continues to show elevations, we do not need medication yet but may in the future.  Continue heavy focus on diet and exercise.  Any questions? Keep being stellar!!  Thank you for allowing me to participate in your care.  I appreciate you. Kindest regards, [employer]

## 2023-06-02 ENCOUNTER — Other Ambulatory Visit: Payer: Self-pay | Admitting: Nurse Practitioner

## 2023-06-02 NOTE — Telephone Encounter (Signed)
 Requested Prescriptions  Pending Prescriptions Disp Refills   lisinopril -hydrochlorothiazide  (ZESTORETIC ) 20-12.5 MG tablet [Pharmacy Med Name: LISINOPRIL -HCTZ 20-12.5 MG TAB] 90 tablet 0    Sig: TAKE 1 TABLET BY MOUTH EVERY DAY     Cardiovascular:  ACEI + Diuretic Combos Failed - 06/02/2023  2:13 PM      Failed - Valid encounter within last 6 months    Recent Outpatient Visits   None     Future Appointments             In 2 months Alicia Copeland, Alicia Posey, Alicia Copeland [facility]            Passed - Na in normal range and within 180 days    Sodium  Date Value Ref Range Status  02/07/2023 144 134 - 144 mmol/L Final         Passed - K in normal range and within 180 days    Potassium  Date Value Ref Range Status  02/07/2023 4.0 3.5 - 5.2 mmol/L Final         Passed - Cr in normal range and within 180 days    Creatinine, Ser  Date Value Ref Range Status  02/07/2023 0.79 0.57 - 1.00 mg/dL Final         Passed - eGFR is 30 or above and within 180 days    GFR calc Af Amer  Date Value Ref Range Status  07/29/2019 82 >59 mL/min/1.73 Final    Comment:    **[facility] currently reports eGFR in compliance with the current**   recommendations of the [employer]. [facility] will   update reporting as new guidelines are published from the NKF-ASN   Task force.    GFR calc non Af Amer  Date Value Ref Range Status  07/29/2019 71 >59 mL/min/1.73 Final   eGFR  Date Value Ref Range Status  02/07/2023 85 >59 mL/min/1.73 Final         Passed - Patient is not pregnant      Passed - Last BP in normal range    BP Readings from Last 1 Encounters:  02/07/23 127/84

## 2023-08-08 NOTE — Patient Instructions (Signed)
 Be Involved in Caring For Your Health:  Taking Medications When medications are taken as directed, they can greatly improve your health. But if they are not taken as prescribed, they may not work. In some cases, not taking them correctly can be harmful. To help ensure your treatment remains effective and safe, understand your medications and how to take them. Bring your medications to each visit for review by your provider.  Your lab results, notes, and after visit summary will be available on My Chart. We strongly encourage you to use this feature. If lab results are abnormal the clinic will contact you with the appropriate steps. If the clinic does not contact you assume the results are satisfactory. You can always view your results on My Chart. If you have questions regarding your health or results, please contact the clinic during office hours. You can also ask questions on My Chart.  We at [facility] are grateful that you chose [place] to provide your care. We strive to provide evidence-based and compassionate care and are always looking for feedback. If you get a survey from the clinic please complete this so we can hear your opinions.  Heart-Healthy Eating Plan Many factors influence your heart health, including eating and exercise habits. Heart health is also called coronary health. Coronary risk increases with abnormal blood fat (lipid) levels. A heart-healthy eating plan includes limiting unhealthy fats, increasing healthy fats, limiting salt (sodium) intake, and making other diet and lifestyle changes. What is my plan? Your health care provider may recommend that: You limit your fat intake to _________% or less of your total calories each day. You limit your saturated fat intake to _________% or less of your total calories each day. You limit the amount of cholesterol in your diet to less than _________ mg per day. You limit the amount of sodium in your diet to less than _________  mg per day. What are tips for following this plan? Cooking Cook foods using methods other than frying. Baking, boiling, grilling, and broiling are all good options. Other ways to reduce fat include: Removing the skin from poultry. Removing all visible fats from meats. Steaming vegetables in water or broth. Meal planning  At meals, imagine dividing your plate into fourths: Fill one-half of your plate with vegetables and green salads. Fill one-fourth of your plate with whole grains. Fill one-fourth of your plate with lean protein foods. Eat 2-4 cups of vegetables per day. One cup of vegetables equals 1 cup (91 g) broccoli or cauliflower florets, 2 medium carrots, 1 large bell pepper, 1 large sweet potato, 1 large tomato, 1 medium white potato, 2 cups (150 g) raw leafy greens. Eat 1-2 cups of fruit per day. One cup of fruit equals 1 small apple, 1 large banana, 1 cup (237 g) mixed fruit, 1 large orange,  cup (82 g) dried fruit, 1 cup (240 mL) 100% fruit juice. Eat more foods that contain soluble fiber. Examples include apples, broccoli, carrots, beans, peas, and barley. Aim to get 25-30 g of fiber per day. Increase your consumption of legumes, nuts, and seeds to 4-5 servings per week. One serving of dried beans or legumes equals  cup (90 g) cooked, 1 serving of nuts is  oz (12 almonds, 24 pistachios, or 7 walnut halves), and 1 serving of seeds equals  oz (8 g). Fats Choose healthy fats more often. Choose monounsaturated and polyunsaturated fats, such as olive and canola oils, avocado oil, flaxseeds, walnuts, almonds, and seeds. Eat  more omega-3 fats. Choose salmon, mackerel, sardines, tuna, flaxseed oil, and ground flaxseeds. Aim to eat fish at least 2 times each week. Check food labels carefully to identify foods with trans fats or high amounts of saturated fat. Limit saturated fats. These are found in animal products, such as meats, butter, and cream. Plant sources of saturated fats  include palm oil, palm kernel oil, and coconut oil. Avoid foods with partially hydrogenated oils in them. These contain trans fats. Examples are stick margarine, some tub margarines, cookies, crackers, and other baked goods. Avoid fried foods. General information Eat more home-cooked food and less restaurant, buffet, and fast food. Limit or avoid alcohol. Limit foods that are high in added sugar and simple starches such as foods made using white refined flour (white breads, pastries, sweets). Lose weight if you are overweight. Losing just 5-10% of your body weight can help your overall health and prevent diseases such as diabetes and heart disease. Monitor your sodium intake, especially if you have high blood pressure. Talk with your health care provider about your sodium intake. Try to incorporate more vegetarian meals weekly. What foods should I eat? Fruits All fresh, canned (in natural juice), or frozen fruits. Vegetables Fresh or frozen vegetables (raw, steamed, roasted, or grilled). Green salads. Grains Most grains. Choose whole wheat and whole grains most of the time. Rice and pasta, including brown rice and pastas made with whole wheat. Meats and other proteins Lean, well-trimmed beef, veal, pork, and lamb. Chicken and [place] without skin. All fish and shellfish. Wild duck, rabbit, pheasant, and venison. Egg whites or low-cholesterol egg substitutes. Dried beans, peas, lentils, and tofu. Seeds and most nuts. Dairy Low-fat or nonfat cheeses, including ricotta and mozzarella. Skim or 1% milk (liquid, powdered, or evaporated). Buttermilk made with low-fat milk. Nonfat or low-fat yogurt. Fats and oils Non-hydrogenated (trans-free) margarines. Vegetable oils, including soybean, sesame, sunflower, olive, avocado, peanut, safflower, corn, canola, and cottonseed. Salad dressings or mayonnaise made with a vegetable oil. Beverages Water (mineral or sparkling). Coffee and tea. Unsweetened ice  tea. Diet beverages. Sweets and desserts Sherbet, gelatin, and fruit ice. Small amounts of dark chocolate. Limit all sweets and desserts. Seasonings and condiments All seasonings and condiments. The items listed above may not be a complete list of foods and beverages you can eat. Contact a dietitian for more options. What foods should I avoid? Fruits Canned fruit in heavy syrup. Fruit in cream or butter sauce. Fried fruit. Limit coconut. Vegetables Vegetables cooked in cheese, cream, or butter sauce. Fried vegetables. Grains Breads made with saturated or trans fats, oils, or whole milk. Croissants. Sweet rolls. Donuts. High-fat crackers, such as cheese crackers and chips. Meats and other proteins Fatty meats, such as hot dogs, ribs, sausage, bacon, rib-eye roast or steak. High-fat deli meats, such as salami and bologna. Caviar. Domestic duck and goose. Organ meats, such as liver. Dairy Cream, sour cream, cream cheese, and creamed cottage cheese. Whole-milk cheeses. Whole or 2% milk (liquid, evaporated, or condensed). Whole buttermilk. Cream sauce or high-fat cheese sauce. Whole-milk yogurt. Fats and oils Meat fat, or shortening. Cocoa butter, hydrogenated oils, palm oil, coconut oil, palm kernel oil. Solid fats and shortenings, including bacon fat, salt pork, lard, and butter. Nondairy cream substitutes. Salad dressings with cheese or sour cream. Beverages Regular sodas and any drinks with added sugar. Sweets and desserts Frosting. Pudding. Cookies. Cakes. Pies. Milk chocolate or white chocolate. Buttered syrups. Full-fat ice cream or ice cream drinks. The items listed above may  not be a complete list of foods and beverages to avoid. Contact a dietitian for more information. Summary Heart-healthy meal planning includes limiting unhealthy fats, increasing healthy fats, limiting salt (sodium) intake and making other diet and lifestyle changes. Lose weight if you are overweight. Losing just  5-10% of your body weight can help your overall health and prevent diseases such as diabetes and heart disease. Focus on eating a balance of foods, including fruits and vegetables, low-fat or nonfat dairy, lean protein, nuts and legumes, whole grains, and heart-healthy oils and fats. This information is not intended to replace advice given to you by your health care provider. Make sure you discuss any questions you have with your health care provider. Document Revised: 03/01/2021 Document Reviewed: 03/01/2021 Elsevier Patient Education  2024 [employer].

## 2023-08-14 ENCOUNTER — Ambulatory Visit: Payer: Self-pay | Admitting: Nurse Practitioner

## 2023-08-14 ENCOUNTER — Encounter: Payer: Self-pay | Admitting: Nurse Practitioner

## 2023-08-14 VITALS — BP 113/73 | HR 73 | Temp 98.2°F | Ht 66.0 in | Wt 141.0 lb

## 2023-08-14 DIAGNOSIS — E7800 Pure hypercholesterolemia, unspecified: Secondary | ICD-10-CM

## 2023-08-14 DIAGNOSIS — I10 Essential (primary) hypertension: Secondary | ICD-10-CM | POA: Diagnosis not present

## 2023-08-14 LAB — MICROALBUMIN, URINE WAIVED
Creatinine, Urine Waived: 200 mg/dL (ref 10–300)
Microalb, Ur Waived: 80 mg/L — ABNORMAL HIGH (ref 0–19)

## 2023-08-14 MED ORDER — LISINOPRIL-HYDROCHLOROTHIAZIDE 20-12.5 MG PO TABS: 1.0000 | ORAL_TABLET | Freq: Every day | ORAL | 4 refills | Status: AC

## 2023-08-14 NOTE — Assessment & Plan Note (Signed)
 Noted on past labs with LDL <190 and ASCVD 5.4% -- may benefit from statin in future, but for now continue diet and exercise focus.  No history of MI or CVA.  At this time recheck lipid panel.

## 2023-08-14 NOTE — Progress Notes (Signed)
 BP 113/73   Pulse 73   Temp 98.2 F (36.8 C) (Oral)   Ht 5' 6 (1.676 m)   Wt 141 lb (64 kg)   LMP 10/07/2016 (Approximate)   SpO2 98%   BMI 22.76 kg/m    Subjective:    Patient ID: Alicia Copeland, female    DOB: Nov 27, 1960, 63 y.o.   MRN: [id number]  HPI: AUNDRIA Copeland is a 63 y.o. 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 Working on diet and exercise, with further weight loss present. Hypertension status: stable  Satisfied with current treatment? yes Duration of hypertension: chronic BP monitoring frequency:  not checking BP range:  BP medication side effects:  no Medication compliance: good compliance Aspirin: no Recurrent headaches: no Visual changes: no Palpitations: no Dyspnea: no Chest pain: no Lower extremity edema: no Dizzy/lightheaded: no  The 10-year ASCVD risk score (Arnett DK, et al., 2019) is: 5.4%   Values used to calculate the score:     Age: 64 years     Clincally relevant sex: Female     Is Non-Hispanic African American: No     Diabetic: No     Tobacco smoker: No     Systolic Blood Pressure: 113 mmHg     Is BP treated: Yes     HDL Cholesterol: 45 mg/dL     Total Cholesterol: 243 mg/dL   Relevant past medical, surgical, family and social history reviewed and updated as indicated. Interim medical history since our last visit reviewed. Allergies and medications reviewed and updated.  Review of Systems  Constitutional:  Negative for activity change, appetite change, diaphoresis, fatigue and fever.  Respiratory:  Negative for cough, chest tightness and shortness of breath.   Cardiovascular:  Negative for chest pain, palpitations and leg swelling.  Gastrointestinal: Negative.   Neurological: Negative.   Psychiatric/Behavioral: Negative.      Per HPI unless specifically indicated above     Objective:    BP 113/73    Pulse 73   Temp 98.2 F (36.8 C) (Oral)   Ht 5' 6 (1.676 m)   Wt 141 lb (64 kg)   LMP 10/07/2016 (Approximate)   SpO2 98%   BMI 22.76 kg/m   Wt Readings from Last 3 Encounters:  08/14/23 141 lb (64 kg)  02/07/23 144 lb 6.4 oz (65.5 kg)  09/07/22 146 lb 9.6 oz (66.5 kg)    Physical Exam Vitals and nursing note reviewed.  Constitutional:      General: She is awake. She is not in acute distress.    Appearance: She is well-developed and well-groomed. She is not ill-appearing or toxic-appearing.  HENT:     Head: Normocephalic.     Right Ear: Hearing and external ear normal.     Left Ear: Hearing and external ear normal.  Eyes:     General: Lids are normal.        Right eye: No discharge.        Left eye: No discharge.     Conjunctiva/sclera: Conjunctivae normal.     Pupils: Pupils are equal, round, and reactive to light.  Neck:     Thyroid: No thyromegaly.     Vascular: No carotid bruit.  Cardiovascular:     Rate and Rhythm: Normal rate and regular rhythm.     Heart sounds: Normal heart sounds. No murmur heard.    No gallop.  Pulmonary:     Effort: Pulmonary effort is normal. No accessory muscle usage or respiratory distress.     Breath sounds: Normal breath sounds.  Abdominal:     General: Bowel sounds are normal. There is no distension.     Palpations: Abdomen is soft.     Tenderness: There is no abdominal tenderness.  Musculoskeletal:     Cervical back: Normal range of motion and neck supple.     Right lower leg: No edema.     Left lower leg: No edema.  Lymphadenopathy:     Cervical: No cervical adenopathy.  Skin:    General: Skin is warm and dry.  Neurological:     Mental Status: She is alert and oriented to person, place, and time.     Deep Tendon Reflexes: Reflexes are normal and symmetric.     Reflex Scores:      Brachioradialis reflexes are 2+ on the right side and 2+ on the left side.      Patellar reflexes are 2+ on the right side and 2+ on the left  side. Psychiatric:        Attention and Perception: Attention normal.        Mood and Affect: Mood normal.        Speech: Speech normal.        Behavior: Behavior normal. Behavior is cooperative.        Thought Content: Thought content normal.    Results for orders placed or performed in visit on 02/07/23  Basic metabolic panel   Collection Time: 02/07/23 10:43 AM  Result Value Ref Range   Glucose 111 (H) 70 - 99 mg/dL   BUN 20 8 - 27 mg/dL   Creatinine, Ser 9.20 0.57 - 1.00 mg/dL   eGFR 85 >40 fO/fpw/8.26   BUN/Creatinine Ratio 25 12 - 28   Sodium 144 134 - 144 mmol/L   Potassium 4.0 3.5 - 5.2 mmol/L   Chloride 103 96 - 106 mmol/L   CO2 27 20 - 29 mmol/L   Calcium 9.9 8.7 - 10.3 mg/dL  Lipid Panel w/o Chol/HDL Ratio   Collection Time: 02/07/23 10:43 AM  Result Value Ref Range   Cholesterol, Total 243 (H) 100 - 199 mg/dL   Triglycerides 836 (H) 0 - 149 mg/dL   HDL 45 >60 mg/dL   VLDL Cholesterol Cal 30 5 - 40 mg/dL   LDL Chol Calc (NIH) 831 (H) 0 - 99 mg/dL      Assessment & Plan:   Problem List Items Addressed This Visit       Cardiovascular and Mediastinum   Hypertension - Primary   Chronic, stable with BP at goal today in office.  Will check CMP today to ensure no drop in NA+.  If NA+ drops will stop HCTZ and go up on Lisinopril , could also consider Amlodipine in future if needed.  Recommend she monitor BP at home at least 3 mornings a week and document + focus on DASH diet.  LABS: CMP, CBC, TSH, urine ALB.      Relevant Medications   lisinopril -hydrochlorothiazide  (ZESTORETIC ) 20-12.5 MG tablet   Other Relevant Orders   CBC with Differential/Platelet   Comprehensive metabolic panel with GFR   TSH   Microalbumin, Urine Waived     Other   Elevated low density lipoprotein (LDL) cholesterol level   Noted on past labs with LDL <190 and ASCVD 5.4% -- may benefit from statin in future, but for now continue diet and exercise  focus.  No history of MI or CVA.  At this  time recheck lipid panel.      Relevant Orders   Comprehensive metabolic panel with GFR   Lipid Panel w/o Chol/HDL Ratio     Follow up plan: Return in about 6 months (around 02/14/2024) for HTN/HLD.

## 2023-08-14 NOTE — Assessment & Plan Note (Signed)
 Chronic, stable with BP at goal today in office.  Will check CMP today to ensure no drop in NA+.  If NA+ drops will stop HCTZ and go up on Lisinopril , could also consider Amlodipine in future if needed.  Recommend she monitor BP at home at least 3 mornings a week and document + focus on DASH diet.  LABS: CMP, CBC, TSH, urine ALB.

## 2023-08-15 ENCOUNTER — Ambulatory Visit: Payer: Self-pay | Admitting: Nurse Practitioner

## 2023-08-15 LAB — COMPREHENSIVE METABOLIC PANEL WITH GFR
ALT: 11 IU/L (ref 0–32)
AST: 16 IU/L (ref 0–40)
Albumin: 4.6 g/dL (ref 3.9–4.9)
Alkaline Phosphatase: 84 IU/L (ref 44–121)
BUN/Creatinine Ratio: 21 (ref 12–28)
BUN: 15 mg/dL (ref 8–27)
Bilirubin Total: 0.3 mg/dL (ref 0.0–1.2)
CO2: 26 mmol/L (ref 20–29)
Calcium: 9.7 mg/dL (ref 8.7–10.3)
Chloride: 102 mmol/L (ref 96–106)
Creatinine, Ser: 0.71 mg/dL (ref 0.57–1.00)
Globulin, Total: 2.1 g/dL (ref 1.5–4.5)
Glucose: 89 mg/dL (ref 70–99)
Potassium: 4.1 mmol/L (ref 3.5–5.2)
Sodium: 143 mmol/L (ref 134–144)
Total Protein: 6.7 g/dL (ref 6.0–8.5)
eGFR: 96 mL/min/1.73 (ref >59)

## 2023-08-15 LAB — CBC WITH DIFFERENTIAL/PLATELET
Basophils Absolute: 0 x10E3/uL (ref 0.0–0.2)
Basos: 1 %
EOS (ABSOLUTE): 0.2 x10E3/uL (ref 0.0–0.4)
Eos: 3 %
Hematocrit: 40.7 % (ref 34.0–46.6)
Hemoglobin: 13.1 g/dL (ref 11.1–15.9)
Immature Grans (Abs): 0 x10E3/uL (ref 0.0–0.1)
Immature Granulocytes: 0 %
Lymphocytes Absolute: 2.2 x10E3/uL (ref 0.7–3.1)
Lymphs: 38 %
MCH: 29.8 pg (ref 26.6–33.0)
MCHC: 32.2 g/dL (ref 31.5–35.7)
MCV: 93 fL (ref 79–97)
Monocytes Absolute: 0.3 x10E3/uL (ref 0.1–0.9)
Monocytes: 6 %
Neutrophils Absolute: 3.1 x10E3/uL (ref 1.4–7.0)
Neutrophils: 52 %
Platelets: 232 x10E3/uL (ref 150–450)
RBC: 4.39 x10E6/uL (ref 3.77–5.28)
RDW: 13.4 % (ref 11.7–15.4)
WBC: 5.8 x10E3/uL (ref 3.4–10.8)

## 2023-08-15 LAB — LIPID PANEL W/O CHOL/HDL RATIO
Cholesterol, Total: 223 mg/dL — ABNORMAL HIGH (ref 100–199)
HDL: 44 mg/dL (ref >39)
LDL Chol Calc (NIH): 146 mg/dL — ABNORMAL HIGH (ref 0–99)
Triglycerides: 185 mg/dL — ABNORMAL HIGH (ref 0–149)
VLDL Cholesterol Cal: 33 mg/dL (ref 5–40)

## 2023-08-15 LAB — TSH: TSH: 3.14 u[IU]/mL (ref 0.450–4.500)

## 2023-08-15 NOTE — Progress Notes (Signed)
 Contacted via MyChart The 10-year ASCVD risk score (Arnett DK, et al., 2019) is: 5.2%   Values used to calculate the score:     Age: 63 years     Clincally relevant sex: Female     Is Non-Hispanic African American: No     Diabetic: No     Tobacco smoker: No     Systolic Blood Pressure: 113 mmHg     Is BP treated: Yes     HDL Cholesterol: 44 mg/dL     Total Cholesterol: 223 mg/dL  Good afternoon Delon, your labs have returned and overall remain stable with exception of ongoing elevations on lipid panel.  Continue heavy focus on diet changes and regular exercise.  No medication needed at this time.  Any questions? Keep being awesome!!  Thank you for allowing me to participate in your care.  I appreciate you. Kindest regards, Kadar Chance

## 2023-12-29 ENCOUNTER — Telehealth: Payer: Self-pay | Admitting: Nurse Practitioner

## 2023-12-29 NOTE — Telephone Encounter (Signed)
Patient will be going to [facility]

## 2023-12-29 NOTE — Telephone Encounter (Signed)
 Copied from CRM #[id number]. Topic: Clinical - Medical Advice >> Dec 29, 2023  1:12 PM Sophia H wrote: Reason for CRM: Patient is having UTI symptoms - wanting to know if PCP would be able to just send over medication or if she needs to come in and leave a urine sample. Please advise, no orders in chart for urine sample. No blood in urine, just same symptoms she has had in the past and she has had it for the last week.   [id number] [address], [place] - [address]

## 2023-12-29 NOTE — Telephone Encounter (Signed)
 Patient will need an appointment before medication can be sent in. Please call to schedule. Patient can do virtual if she can come leave urine sample before the appointment.

## 2024-02-10 NOTE — Patient Instructions (Signed)
 Be Involved in Caring For Your Health:  Taking Medications When medications are taken as directed, they can greatly improve your health. But if they are not taken as prescribed, they may not work. In some cases, not taking them correctly can be harmful. To help ensure your treatment remains effective and safe, understand your medications and how to take them. Bring your medications to each visit for review by your provider.  Your lab results, notes, and after visit summary will be available on My Chart. We strongly encourage you to use this feature. If lab results are abnormal the clinic will contact you with the appropriate steps. If the clinic does not contact you assume the results are satisfactory. You can always view your results on My Chart. If you have questions regarding your health or results, please contact the clinic during office hours. You can also ask questions on My Chart.  We at [facility] are grateful that you chose us  to provide your care. We strive to provide evidence-based and compassionate care and are always looking for feedback. If you get a survey from the clinic please complete this so we can hear your opinions.  DASH Eating Plan DASH stands for Dietary Approaches to Stop Hypertension. The DASH eating plan is a healthy eating plan that has been shown to: Lower high blood pressure (hypertension). Reduce your risk for type 2 diabetes, heart disease, and stroke. Help with weight loss. What are tips for following this plan? Reading food labels Check food labels for the amount of salt (sodium) per serving. Choose foods with less than 5 percent of the Daily Value (DV) of sodium. In general, foods with less than 300 milligrams (mg) of sodium per serving fit into this eating plan. To find whole grains, look for the word whole as the first word in the ingredient list. Shopping Buy products labeled as low-sodium or no salt added. Buy fresh foods. Avoid canned  foods and pre-made or frozen meals. Cooking Try not to add salt when you cook. Use salt-free seasonings or herbs instead of table salt or sea salt. Check with your health care provider or pharmacist before using salt substitutes. Do not fry foods. Cook foods in healthy ways, such as baking, boiling, grilling, roasting, or broiling. Cook using oils that are good for your heart. These include olive, canola, avocado, soybean, and sunflower oil. Meal planning  Eat a balanced diet. This should include: 4 or more servings of fruits and 4 or more servings of vegetables each day. Try to fill half of your plate with fruits and vegetables. 6-8 servings of whole grains each day. 6 or less servings of lean meat, poultry, or fish each day. 1 oz is 1 serving. A 3 oz (85 g) serving of meat is about the same size as the palm of your hand. One egg is 1 oz (28 g). 2-3 servings of low-fat dairy each day. One serving is 1 cup (237 mL). 1 serving of nuts, seeds, or beans 5 times each week. 2-3 servings of heart-healthy fats. Healthy fats called omega-3 fatty acids are found in foods such as walnuts, flaxseeds, fortified milks, and eggs. These fats are also found in cold-water fish, such as sardines, salmon, and mackerel. Limit how much you eat of: Canned or prepackaged foods. Food that is high in trans fat, such as fried foods. Food that is high in saturated fat, such as fatty meat. Desserts and other sweets, sugary drinks, and other foods with added sugar. Full-fat  dairy products. Do not salt foods before eating. Do not eat more than 4 egg yolks a week. Try to eat at least 2 vegetarian meals a week. Eat more home-cooked food and less restaurant, buffet, and fast food. Lifestyle When eating at a restaurant, ask if your food can be made with less salt or no salt. If you drink alcohol: Limit how much you have to: 0-1 drink a day if you are female. 0-2 drinks a day if you are female. Know how much alcohol is in  your drink. In the [place] one drink is one 12 oz bottle of beer (355 mL), one 5 oz glass of wine (148 mL), or one 1 oz glass of hard liquor (44 mL). General information Avoid eating more than 2,300 mg of salt a day. If you have hypertension, you may need to reduce your sodium intake to 1,500 mg a day. Work with your provider to stay at a healthy body weight or lose weight. Ask what the best weight range is for you. On most days of the week, get at least 30 minutes of exercise that causes your heart to beat faster. This may include walking, swimming, or biking. Work with your provider or dietitian to adjust your eating plan to meet your specific calorie needs. What foods should I eat? Fruits All fresh, dried, or frozen fruit. Canned fruits that are in their natural juice and do not have sugar added to them. Vegetables Fresh or frozen vegetables that are raw, steamed, roasted, or grilled. Low-sodium or reduced-sodium tomato and vegetable juice. Low-sodium or reduced-sodium tomato sauce and tomato paste. Low-sodium or reduced-sodium canned vegetables. Grains Whole-grain or whole-wheat bread. Whole-grain or whole-wheat pasta. Brown rice. [place]il Madeira. Bulgur. Whole-grain and low-sodium cereals. Pita bread. Low-fat, low-sodium crackers. Whole-wheat flour tortillas. Meats and other proteins Skinless chicken or [place]. Ground chicken or [place]. Pork with fat trimmed off. Fish and seafood. Egg whites. Dried beans, peas, or lentils. Unsalted nuts, nut butters, and seeds. Unsalted canned beans. Lean cuts of beef with fat trimmed off. Low-sodium, lean precooked or cured meat, such as sausages or meat loaves. Dairy Low-fat (1%) or fat-free (skim) milk. Reduced-fat, low-fat, or fat-free cheeses. Nonfat, low-sodium ricotta or cottage cheese. Low-fat or nonfat yogurt. Low-fat, low-sodium cheese. Fats and oils Soft margarine without trans fats. Vegetable oil. Reduced-fat, low-fat, or light mayonnaise and salad  dressings (reduced-sodium). Canola, safflower, olive, avocado, soybean, and sunflower oils. Avocado. Seasonings and condiments Herbs. Spices. Seasoning mixes without salt. Other foods Unsalted popcorn and pretzels. Fat-free sweets. The items listed above may not be all the foods and drinks you can have. Talk to a dietitian to learn more. What foods should I avoid? Fruits Canned fruit in a light or heavy syrup. Fried fruit. Fruit in cream or butter sauce. Vegetables Creamed or fried vegetables. Vegetables in a cheese sauce. Regular canned vegetables that are not marked as low-sodium or reduced-sodium. Regular canned tomato sauce and paste that are not marked as low-sodium or reduced-sodium. Regular tomato and vegetable juices that are not marked as low-sodium or reduced-sodium. Dene. Olives. Grains Baked goods made with fat, such as croissants, muffins, or some breads. Dry pasta or rice meal packs. Meats and other proteins Fatty cuts of meat. Ribs. Fried meat. [place]. Bologna, salami, and other precooked or cured meats, such as sausages or meat loaves, that are not lean and low in sodium. Fat from the back of a pig (fatback). Bratwurst. Salted nuts and seeds. Canned beans with added salt. Canned  or smoked fish. Whole eggs or egg yolks. Chicken or [place] with skin. Dairy Whole or 2% milk, cream, and half-and-half. Whole or full-fat cream cheese. Whole-fat or sweetened yogurt. Full-fat cheese. Nondairy creamers. Whipped toppings. Processed cheese and cheese spreads. Fats and oils Butter. Stick margarine. Lard. Shortening. Ghee. Bacon fat. Tropical oils, such as coconut, palm kernel, or palm oil. Seasonings and condiments Onion salt, garlic salt, seasoned salt, table salt, and sea salt. Worcestershire sauce. Tartar sauce. Barbecue sauce. Teriyaki sauce. Soy sauce, including reduced-sodium soy sauce. Steak sauce. Canned and packaged gravies. Fish sauce. Oyster sauce. Cocktail sauce. Store-bought  horseradish. Ketchup. Mustard. Meat flavorings and tenderizers. Bouillon cubes. Hot sauces. Pre-made or packaged marinades. Pre-made or packaged taco seasonings. Relishes. Regular salad dressings. Other foods Salted popcorn and pretzels. The items listed above may not be all the foods and drinks you should avoid. Talk to a dietitian to learn more. Where to find more information National Heart, Lung, and Blood Institute (NHLBI): BuffaloDryCleaner.gl American Heart Association (AHA): heart.org Academy of Nutrition and Dietetics: eatright.org National Kidney Foundation (NKF): kidney.org This information is not intended to replace advice given to you by your health care provider. Make sure you discuss any questions you have with your health care provider. Document Revised: 02/10/2022 Document Reviewed: 02/10/2022 Elsevier Patient Education  2024 [employer].

## 2024-02-14 ENCOUNTER — Encounter: Payer: Self-pay | Admitting: Nurse Practitioner

## 2024-02-14 ENCOUNTER — Ambulatory Visit: Admitting: Nurse Practitioner

## 2024-02-14 ENCOUNTER — Ambulatory Visit: Payer: Self-pay | Admitting: Nurse Practitioner

## 2024-02-14 VITALS — BP 130/78 | HR 77 | Temp 98.2°F | Resp 16 | Ht 65.98 in | Wt 148.0 lb

## 2024-02-14 DIAGNOSIS — E7800 Pure hypercholesterolemia, unspecified: Secondary | ICD-10-CM

## 2024-02-14 DIAGNOSIS — I10 Essential (primary) hypertension: Secondary | ICD-10-CM

## 2024-02-14 LAB — URINALYSIS, ROUTINE W REFLEX MICROSCOPIC
Bilirubin, UA: NEGATIVE
Glucose, UA: NEGATIVE
Ketones, UA: NEGATIVE
Leukocytes,UA: NEGATIVE
Nitrite, UA: NEGATIVE
Protein,UA: NEGATIVE
Specific Gravity, UA: 1.02 (ref 1.005–1.030)
Urobilinogen, Ur: 0.2 mg/dL (ref 0.2–1.0)
pH, UA: 5.5 (ref 5.0–7.5)

## 2024-02-14 LAB — MICROSCOPIC EXAMINATION: Bacteria, UA: NONE SEEN

## 2024-02-14 NOTE — Progress Notes (Signed)
 "  BP 130/78 (BP Location: Left Arm, Patient Position: Sitting, Cuff Size: Normal)   Pulse 77   Temp 98.2 F (36.8 C) (Oral)   Resp 16   Ht 5' 5.98 (1.676 m)   Wt 148 lb (67.1 kg)   LMP 10/07/2016   SpO2 95%   BMI 23.90 kg/m    Subjective:    Patient ID: Alicia Copeland, female    DOB: 10-24-1960, 64 y.o.   MRN: [id number]  HPI: Alicia Copeland is a 64 y.o. female  Chief Complaint  Patient presents with   Follow-up    Here for check up no concerns.   HYPERTENSION / HYPERLIPIDEMIA Takes Lisinopril -hydrochlorothiazide  20-12.5 MG.  Satisfied with current treatment? yes Duration of hypertension: chronic BP monitoring frequency: occasional BP range:  BP medication side effects: no Duration of hyperlipidemia: chronic Medication compliance: good compliance Aspirin: no Recent stressors: no Recurrent headaches: no Visual changes: no Palpitations: no Dyspnea: no Chest pain: no Lower extremity edema: no Dizzy/lightheaded: no  The 10-year ASCVD risk score (Arnett DK, et al., 2019) is: 6.8%   Values used to calculate the score:     Age: 89 years     Clinically relevant sex: Female     Is Non-Hispanic African American: No     Diabetic: No     Tobacco smoker: No     Systolic Blood Pressure: 130 mmHg     Is BP treated: Yes     HDL Cholesterol: 44 mg/dL     Total Cholesterol: 223 mg/dL     09/09/7971   89:53 AM 08/14/2023   10:24 AM 02/07/2023   10:21 AM 09/07/2022    1:11 PM 08/17/2022    2:16 PM  Depression screen PHQ 2/9  Decreased Interest 0 0 0 0 0  Down, Depressed, Hopeless 0 0 0 0 0  PHQ - 2 Score 0 0 0 0 0  Altered sleeping 0 0 0 0 0  Tired, decreased energy 0 0 0 0 0  Change in appetite 0 0 0 0 0  Feeling bad or failure about yourself  0 0 0 0 0  Trouble concentrating 0 0 0 0 0  Moving slowly or fidgety/restless 0 0 0 0 0  Suicidal thoughts 0 0 0 0 0  PHQ-9 Score 0 0  0  0  0   Difficult doing work/chores Not difficult at all Not difficult at all Not  difficult at all Not difficult at all Not difficult at all     Data saved with a previous flowsheet row definition       02/14/2024   10:46 AM 08/14/2023   10:24 AM 02/07/2023   10:21 AM 09/07/2022    1:12 PM  GAD 7 : Generalized Anxiety Score  Nervous, Anxious, on Edge 0 0 0 0  Control/stop worrying 0 0 0 0  Worry too much - different things 0 0 0 0  Trouble relaxing 0 0 0 0  Restless 0 0 0 0  Easily annoyed or irritable 0 0 0 0  Afraid - awful might happen 0 0 0 0  Total GAD 7 Score 0 0 0 0  Anxiety Difficulty Not difficult at all Not difficult at all Not difficult at all Not difficult at all   Relevant past medical, surgical, family and social history reviewed and updated as indicated. Interim medical history since our last visit reviewed. Allergies and medications reviewed and updated.  Review of Systems  Constitutional:  Negative for  activity change, appetite change, diaphoresis, fatigue and fever.  Respiratory:  Negative for cough, chest tightness and shortness of breath.   Cardiovascular:  Negative for chest pain, palpitations and leg swelling.  Gastrointestinal: Negative.   Neurological: Negative.   Psychiatric/Behavioral: Negative.     Per HPI unless specifically indicated above     Objective:    BP 130/78 (BP Location: Left Arm, Patient Position: Sitting, Cuff Size: Normal)   Pulse 77   Temp 98.2 F (36.8 C) (Oral)   Resp 16   Ht 5' 5.98 (1.676 m)   Wt 148 lb (67.1 kg)   LMP 10/07/2016   SpO2 95%   BMI 23.90 kg/m   Wt Readings from Last 3 Encounters:  02/14/24 148 lb (67.1 kg)  08/14/23 141 lb (64 kg)  02/07/23 144 lb 6.4 oz (65.5 kg)    Physical Exam Vitals and nursing note reviewed.  Constitutional:      General: She is awake. She is not in acute distress.    Appearance: She is well-developed and well-groomed. She is not ill-appearing or toxic-appearing.  HENT:     Head: Normocephalic.     Right Ear: Hearing and external ear normal.     Left Ear:  Hearing and external ear normal.  Eyes:     General: Lids are normal.        Right eye: No discharge.        Left eye: No discharge.     Conjunctiva/sclera: Conjunctivae normal.     Pupils: Pupils are equal, round, and reactive to light.  Neck:     Thyroid: No thyromegaly.     Vascular: No carotid bruit.  Cardiovascular:     Rate and Rhythm: Normal rate and regular rhythm.     Heart sounds: Normal heart sounds. No murmur heard.    No gallop.  Pulmonary:     Effort: Pulmonary effort is normal. No accessory muscle usage or respiratory distress.     Breath sounds: Normal breath sounds.  Abdominal:     General: Bowel sounds are normal. There is no distension.     Palpations: Abdomen is soft.     Tenderness: There is no abdominal tenderness.  Musculoskeletal:     Cervical back: Normal range of motion and neck supple.     Right lower leg: No edema.     Left lower leg: No edema.  Lymphadenopathy:     Cervical: No cervical adenopathy.  Skin:    General: Skin is warm and dry.  Neurological:     Mental Status: She is alert and oriented to person, place, and time.     Deep Tendon Reflexes: Reflexes are normal and symmetric.     Reflex Scores:      Brachioradialis reflexes are 2+ on the right side and 2+ on the left side.      Patellar reflexes are 2+ on the right side and 2+ on the left side. Psychiatric:        Attention and Perception: Attention normal.        Mood and Affect: Mood normal.        Speech: Speech normal.        Behavior: Behavior normal. Behavior is cooperative.        Thought Content: Thought content normal.    Results for orders placed or performed in visit on 08/14/23  Microalbumin, Urine Waived   Collection Time: 08/14/23 10:43 AM  Result Value Ref Range   Microalb, Ur Waived 80 (  H) 0 - 19 mg/L   Creatinine, Urine Waived 200 10 - 300 mg/dL   Microalb/Creat Ratio 30-300 (H) <30 mg/g  CBC with Differential/Platelet   Collection Time: 08/14/23 10:44 AM   Result Value Ref Range   WBC 5.8 3.4 - 10.8 x10E3/uL   RBC 4.39 3.77 - 5.28 x10E6/uL   Hemoglobin 13.1 11.1 - 15.9 g/dL   Hematocrit 59.2 65.9 - 46.6 %   MCV 93 79 - 97 fL   MCH 29.8 26.6 - 33.0 pg   MCHC 32.2 31.5 - 35.7 g/dL   RDW 86.5 88.2 - 84.5 %   Platelets 232 150 - 450 x10E3/uL   Neutrophils 52 Not Estab. %   Lymphs 38 Not Estab. %   Monocytes 6 Not Estab. %   Eos 3 Not Estab. %   Basos 1 Not Estab. %   Neutrophils Absolute 3.1 1.4 - 7.0 x10E3/uL   Lymphocytes Absolute 2.2 0.7 - 3.1 x10E3/uL   Monocytes Absolute 0.3 0.1 - 0.9 x10E3/uL   EOS (ABSOLUTE) 0.2 0.0 - 0.4 x10E3/uL   Basophils Absolute 0.0 0.0 - 0.2 x10E3/uL   Immature Granulocytes 0 Not Estab. %   Immature Grans (Abs) 0.0 0.0 - 0.1 x10E3/uL  Comprehensive metabolic panel with GFR   Collection Time: 08/14/23 10:44 AM  Result Value Ref Range   Glucose 89 70 - 99 mg/dL   BUN 15 8 - 27 mg/dL   Creatinine, Ser 9.28 0.57 - 1.00 mg/dL   eGFR 96 >40 fO/fpw/8.26   BUN/Creatinine Ratio 21 12 - 28   Sodium 143 134 - 144 mmol/L   Potassium 4.1 3.5 - 5.2 mmol/L   Chloride 102 96 - 106 mmol/L   CO2 26 20 - 29 mmol/L   Calcium 9.7 8.7 - 10.3 mg/dL   Total Protein 6.7 6.0 - 8.5 g/dL   Albumin 4.6 3.9 - 4.9 g/dL   Globulin, Total 2.1 1.5 - 4.5 g/dL   Bilirubin Total 0.3 0.0 - 1.2 mg/dL   Alkaline Phosphatase 84 44 - 121 IU/L   AST 16 0 - 40 IU/L   ALT 11 0 - 32 IU/L  Lipid Panel w/o Chol/HDL Ratio   Collection Time: 08/14/23 10:44 AM  Result Value Ref Range   Cholesterol, Total 223 (H) 100 - 199 mg/dL   Triglycerides 814 (H) 0 - 149 mg/dL   HDL 44 >60 mg/dL   VLDL Cholesterol Cal 33 5 - 40 mg/dL   LDL Chol Calc (NIH) 853 (H) 0 - 99 mg/dL  TSH   Collection Time: 08/14/23 10:44 AM  Result Value Ref Range   TSH 3.140 0.450 - 4.500 uIU/mL      Assessment & Plan:   Problem List Items Addressed This Visit       Cardiovascular and Mediastinum   Hypertension - Primary   Chronic, stable with BP at goal today  in office on recheck.  Will check CMP today to ensure no drop in NA+.  If NA+ drops will stop HCTZ and go up on Lisinopril , could also consider Amlodipine in future if needed.  Recommend she monitor BP at home at least 3 mornings a week and document + focus on DASH diet.  LABS: CMP.      Relevant Orders   Basic metabolic panel with GFR   Urinalysis, Routine w reflex microscopic     Other   Elevated low density lipoprotein (LDL) cholesterol level   Noted on past labs with LDL <190 and ASCVD 6.8% --  may benefit from statin in future, but for now continue diet and exercise focus.  No history of MI or CVA.  At this time recheck lipid panel.      Relevant Orders   Lipid Panel w/o Chol/HDL Ratio     Follow up plan: Return in about 26 weeks (around 08/14/2024) for Annual Physical after 08/13/24.      "

## 2024-02-14 NOTE — Assessment & Plan Note (Signed)
 Chronic, stable with BP at goal today in office on recheck.  Will check CMP today to ensure no drop in NA+.  If NA+ drops will stop HCTZ and go up on Lisinopril , could also consider Amlodipine in future if needed.  Recommend she monitor BP at home at least 3 mornings a week and document + focus on DASH diet.  LABS: CMP.

## 2024-02-14 NOTE — Assessment & Plan Note (Signed)
 Noted on past labs with LDL <190 and ASCVD 6.8% -- may benefit from statin in future, but for now continue diet and exercise focus.  No history of MI or CVA.  At this time recheck lipid panel.

## 2024-02-14 NOTE — Progress Notes (Signed)
 Contacted via MyChart  You have a little blood in urine, but less than on past checks. No infection.

## 2024-02-15 LAB — BASIC METABOLIC PANEL WITH GFR
BUN/Creatinine Ratio: 26 (ref 12–28)
BUN: 19 mg/dL (ref 8–27)
CO2: 26 mmol/L (ref 20–29)
Calcium: 10.3 mg/dL (ref 8.7–10.3)
Chloride: 100 mmol/L (ref 96–106)
Creatinine, Ser: 0.73 mg/dL (ref 0.57–1.00)
Glucose: 97 mg/dL (ref 70–99)
Potassium: 4 mmol/L (ref 3.5–5.2)
Sodium: 143 mmol/L (ref 134–144)
eGFR: 93 mL/min/1.73

## 2024-02-15 LAB — LIPID PANEL W/O CHOL/HDL RATIO
Cholesterol, Total: 250 mg/dL — ABNORMAL HIGH (ref 100–199)
HDL: 45 mg/dL
LDL Chol Calc (NIH): 167 mg/dL — ABNORMAL HIGH (ref 0–99)
Triglycerides: 203 mg/dL — ABNORMAL HIGH (ref 0–149)
VLDL Cholesterol Cal: 38 mg/dL (ref 5–40)

## 2024-02-15 NOTE — Progress Notes (Signed)
 Contacted via MyChart The 10-year ASCVD risk score (Arnett DK, et al., 2019) is: 7.3%   Values used to calculate the score:     Age: 64 years     Clinically relevant sex: Female     Is Non-Hispanic African American: No     Diabetic: No     Tobacco smoker: No     Systolic Blood Pressure: 130 mmHg     Is BP treated: Yes     HDL Cholesterol: 45 mg/dL     Total Cholesterol: 250 mg/dL  Good afternoon Alicia Copeland, your labs have returned: - Kidney function, creatinine and eGFR, remains normal. - Lipid panel is showing trends up, they are at level where statin is recommended. Do you want to trial a low dose of Rosuvastatin OR focus on diet/exercise over next months and then recheck at next visit? Let me know. Any questions? Keep being awesome!!  Thank you for allowing me to participate in your care.  I appreciate you. Kindest regards, Vesper Trant

## 2024-02-16 ENCOUNTER — Other Ambulatory Visit: Payer: Self-pay | Admitting: Nurse Practitioner

## 2024-02-16 DIAGNOSIS — Z1231 Encounter for screening mammogram for malignant neoplasm of breast: Secondary | ICD-10-CM

## 2024-03-08 ENCOUNTER — Ambulatory Visit
Admission: RE | Admit: 2024-03-08 | Discharge: 2024-03-08 | Disposition: A | Source: Ambulatory Visit | Attending: Nurse Practitioner | Admitting: Nurse Practitioner

## 2024-03-08 DIAGNOSIS — Z1231 Encounter for screening mammogram for malignant neoplasm of breast: Secondary | ICD-10-CM | POA: Diagnosis present

## 2024-08-15 ENCOUNTER — Encounter: Admitting: Nurse Practitioner
